# Patient Record
Sex: Male | Born: 1950 | Race: White | Hispanic: No | Marital: Married | Smoking: Former smoker
Health system: Southern US, Community
[De-identification: ages and names within clinical notes are randomized; demographics above are authoritative.]

## PROBLEM LIST (undated history)

## (undated) DIAGNOSIS — G4730 Sleep apnea, unspecified: Secondary | ICD-10-CM

## (undated) DIAGNOSIS — T7840XA Allergy, unspecified, initial encounter: Secondary | ICD-10-CM

## (undated) DIAGNOSIS — I2510 Atherosclerotic heart disease of native coronary artery without angina pectoris: Secondary | ICD-10-CM

## (undated) DIAGNOSIS — Z5189 Encounter for other specified aftercare: Secondary | ICD-10-CM

## (undated) DIAGNOSIS — Z87442 Personal history of urinary calculi: Secondary | ICD-10-CM

## (undated) DIAGNOSIS — E785 Hyperlipidemia, unspecified: Secondary | ICD-10-CM

## (undated) DIAGNOSIS — J45909 Unspecified asthma, uncomplicated: Secondary | ICD-10-CM

## (undated) DIAGNOSIS — N289 Disorder of kidney and ureter, unspecified: Secondary | ICD-10-CM

## (undated) DIAGNOSIS — I10 Essential (primary) hypertension: Secondary | ICD-10-CM

## (undated) HISTORY — DX: Allergy, unspecified, initial encounter: T78.40XA

## (undated) HISTORY — DX: Hyperlipidemia, unspecified: E78.5

## (undated) HISTORY — DX: Unspecified asthma, uncomplicated: J45.909

## (undated) HISTORY — DX: Atherosclerotic heart disease of native coronary artery without angina pectoris: I25.10

## (undated) HISTORY — DX: Encounter for other specified aftercare: Z51.89

## (undated) HISTORY — DX: Essential (primary) hypertension: I10

## (undated) HISTORY — PX: EYE SURGERY: SHX253

---

## 2003-01-02 ENCOUNTER — Encounter: Payer: Self-pay | Admitting: Family Medicine

## 2003-10-02 HISTORY — PX: CORONARY ARTERY BYPASS GRAFT: SHX141

## 2003-10-04 ENCOUNTER — Other Ambulatory Visit: Payer: Self-pay

## 2003-10-04 DIAGNOSIS — I2510 Atherosclerotic heart disease of native coronary artery without angina pectoris: Secondary | ICD-10-CM

## 2003-10-04 HISTORY — PX: CORONARY ANGIOPLASTY: SHX604

## 2003-10-04 HISTORY — DX: Atherosclerotic heart disease of native coronary artery without angina pectoris: I25.10

## 2005-01-01 ENCOUNTER — Encounter: Payer: Self-pay | Admitting: Family Medicine

## 2005-01-22 ENCOUNTER — Ambulatory Visit: Payer: Self-pay | Admitting: Family Medicine

## 2005-01-26 ENCOUNTER — Ambulatory Visit: Payer: Self-pay | Admitting: Family Medicine

## 2005-02-10 ENCOUNTER — Ambulatory Visit: Payer: Self-pay | Admitting: Family Medicine

## 2005-03-10 ENCOUNTER — Ambulatory Visit: Payer: Self-pay | Admitting: Family Medicine

## 2005-04-28 ENCOUNTER — Ambulatory Visit: Payer: Self-pay | Admitting: Family Medicine

## 2005-04-30 ENCOUNTER — Ambulatory Visit: Payer: Self-pay | Admitting: Family Medicine

## 2006-08-19 ENCOUNTER — Ambulatory Visit: Payer: Self-pay | Admitting: Family Medicine

## 2006-09-10 ENCOUNTER — Ambulatory Visit: Payer: Self-pay | Admitting: Family Medicine

## 2006-09-10 LAB — CONVERTED CEMR LAB
Albumin: 4 g/dL (ref 3.5–5.2)
Basophils Absolute: 0 10*3/uL (ref 0.0–0.1)
Bilirubin, Direct: 0.1 mg/dL (ref 0.0–0.3)
Calcium: 9.8 mg/dL (ref 8.4–10.5)
Cholesterol: 136 mg/dL (ref 0–200)
Creatinine,U: 114.4 mg/dL
Eosinophils Absolute: 0.4 10*3/uL (ref 0.0–0.6)
Eosinophils Relative: 7.4 % — ABNORMAL HIGH (ref 0.0–5.0)
GFR calc Af Amer: 114 mL/min
GFR calc non Af Amer: 94 mL/min
Glucose, Bld: 131 mg/dL — ABNORMAL HIGH (ref 70–99)
Lymphocytes Relative: 27.8 % (ref 12.0–46.0)
MCHC: 35.2 g/dL (ref 30.0–36.0)
MCV: 90.7 fL (ref 78.0–100.0)
Microalb Creat Ratio: 1.7 mg/g (ref 0.0–30.0)
Neutro Abs: 3.3 10*3/uL (ref 1.4–7.7)
Neutrophils Relative %: 56.2 % (ref 43.0–77.0)
PSA: 0.19 ng/mL (ref 0.10–4.00)
Platelets: 255 10*3/uL (ref 150–400)
Sodium: 141 meq/L (ref 135–145)
Total CHOL/HDL Ratio: 2.7
Triglycerides: 90 mg/dL (ref 0–149)
WBC: 5.8 10*3/uL (ref 4.5–10.5)

## 2006-09-14 ENCOUNTER — Ambulatory Visit: Payer: Self-pay | Admitting: Family Medicine

## 2006-10-20 ENCOUNTER — Ambulatory Visit: Payer: Self-pay | Admitting: Family Medicine

## 2006-10-27 ENCOUNTER — Ambulatory Visit: Payer: Self-pay | Admitting: Family Medicine

## 2007-04-06 ENCOUNTER — Encounter: Payer: Self-pay | Admitting: Family Medicine

## 2007-04-06 DIAGNOSIS — D62 Acute posthemorrhagic anemia: Secondary | ICD-10-CM | POA: Insufficient documentation

## 2007-04-06 DIAGNOSIS — E1129 Type 2 diabetes mellitus with other diabetic kidney complication: Secondary | ICD-10-CM | POA: Insufficient documentation

## 2007-04-06 DIAGNOSIS — E119 Type 2 diabetes mellitus without complications: Secondary | ICD-10-CM | POA: Insufficient documentation

## 2007-04-06 DIAGNOSIS — Z87891 Personal history of nicotine dependence: Secondary | ICD-10-CM | POA: Insufficient documentation

## 2007-04-18 ENCOUNTER — Ambulatory Visit: Payer: Self-pay | Admitting: Family Medicine

## 2007-09-07 ENCOUNTER — Ambulatory Visit: Payer: Self-pay | Admitting: Family Medicine

## 2007-09-07 LAB — CONVERTED CEMR LAB
ALT: 39 units/L (ref 0–53)
Alkaline Phosphatase: 42 units/L (ref 39–117)
BUN: 17 mg/dL (ref 6–23)
Basophils Relative: 0.4 % (ref 0.0–1.0)
CO2: 30 meq/L (ref 19–32)
Calcium: 9.6 mg/dL (ref 8.4–10.5)
Chloride: 103 meq/L (ref 96–112)
GFR calc Af Amer: 90 mL/min
HDL: 55.7 mg/dL (ref >39.0)
Hgb A1c MFr Bld: 5.7 % (ref 4.6–6.0)
LDL Cholesterol: 47 mg/dL (ref 0–99)
Microalb Creat Ratio: 2 mg/g (ref 0.0–30.0)
Monocytes Relative: 7.2 % (ref 3.0–11.0)
Neutro Abs: 3 10*3/uL (ref 1.4–7.7)
Platelets: 244 10*3/uL (ref 150–400)
RBC: 4.79 M/uL (ref 4.22–5.81)
RDW: 12.4 % (ref 11.5–14.6)
TSH: 3.97 microintl units/mL (ref 0.35–5.50)
Total CHOL/HDL Ratio: 2
Total Protein: 7.2 g/dL (ref 6.0–8.3)
Triglycerides: 54 mg/dL (ref 0–149)
VLDL: 11 mg/dL (ref 0–40)

## 2007-09-13 ENCOUNTER — Ambulatory Visit: Payer: Self-pay | Admitting: Family Medicine

## 2008-03-22 ENCOUNTER — Ambulatory Visit: Payer: Self-pay | Admitting: Family Medicine

## 2008-03-22 DIAGNOSIS — I10 Essential (primary) hypertension: Secondary | ICD-10-CM | POA: Insufficient documentation

## 2008-03-22 LAB — CONVERTED CEMR LAB
CO2: 28 meq/L (ref 19–32)
Calcium: 9.3 mg/dL (ref 8.4–10.5)
GFR calc Af Amer: 100 mL/min
Hgb A1c MFr Bld: 5.8 % (ref 4.6–6.0)
Sodium: 138 meq/L (ref 135–145)

## 2008-03-27 ENCOUNTER — Ambulatory Visit: Payer: Self-pay | Admitting: Family Medicine

## 2008-05-16 ENCOUNTER — Telehealth: Payer: Self-pay | Admitting: Family Medicine

## 2008-09-19 ENCOUNTER — Ambulatory Visit: Payer: Self-pay | Admitting: Family Medicine

## 2008-09-20 LAB — CONVERTED CEMR LAB
ALT: 33 units/L (ref 0–53)
Albumin: 4.4 g/dL (ref 3.5–5.2)
Basophils Absolute: 0 10*3/uL (ref 0.0–0.1)
Basophils Relative: 0.9 % (ref 0.0–3.0)
Calcium: 9.6 mg/dL (ref 8.4–10.5)
Cholesterol: 104 mg/dL (ref 0–200)
Creatinine, Ser: 1 mg/dL (ref 0.4–1.5)
Creatinine,U: 124.2 mg/dL
Eosinophils Absolute: 0.3 10*3/uL (ref 0.0–0.7)
GFR calc non Af Amer: 82 mL/min
HCT: 43.4 % (ref 39.0–52.0)
Hemoglobin: 15.1 g/dL (ref 13.0–17.0)
MCHC: 34.7 g/dL (ref 30.0–36.0)
MCV: 91.7 fL (ref 78.0–100.0)
Microalb, Ur: 0.2 mg/dL (ref 0.0–1.9)
Neutro Abs: 2.9 10*3/uL (ref 1.4–7.7)
PSA: 0.18 ng/mL (ref 0.10–4.00)
RBC: 4.73 M/uL (ref 4.22–5.81)
Total Bilirubin: 0.9 mg/dL (ref 0.3–1.2)

## 2008-09-26 ENCOUNTER — Ambulatory Visit: Payer: Self-pay | Admitting: Family Medicine

## 2008-09-26 DIAGNOSIS — E039 Hypothyroidism, unspecified: Secondary | ICD-10-CM | POA: Insufficient documentation

## 2008-10-10 ENCOUNTER — Ambulatory Visit: Payer: Self-pay | Admitting: Family Medicine

## 2008-10-10 LAB — FECAL OCCULT BLOOD, GUAIAC: Fecal Occult Blood: NEGATIVE

## 2008-10-10 LAB — CONVERTED CEMR LAB: OCCULT 3: NEGATIVE

## 2008-10-11 ENCOUNTER — Encounter (INDEPENDENT_AMBULATORY_CARE_PROVIDER_SITE_OTHER): Payer: Self-pay | Admitting: *Deleted

## 2008-11-13 ENCOUNTER — Ambulatory Visit: Payer: Self-pay | Admitting: Family Medicine

## 2008-11-20 ENCOUNTER — Telehealth: Payer: Self-pay | Admitting: Family Medicine

## 2008-12-04 ENCOUNTER — Ambulatory Visit: Payer: Self-pay | Admitting: Family Medicine

## 2008-12-04 DIAGNOSIS — G4730 Sleep apnea, unspecified: Secondary | ICD-10-CM | POA: Insufficient documentation

## 2008-12-07 ENCOUNTER — Ambulatory Visit: Payer: Self-pay | Admitting: Pulmonary Disease

## 2008-12-07 ENCOUNTER — Encounter: Payer: Self-pay | Admitting: Family Medicine

## 2008-12-26 ENCOUNTER — Ambulatory Visit (HOSPITAL_BASED_OUTPATIENT_CLINIC_OR_DEPARTMENT_OTHER): Admission: RE | Admit: 2008-12-26 | Discharge: 2008-12-26 | Payer: Self-pay | Admitting: Pulmonary Disease

## 2008-12-26 ENCOUNTER — Encounter: Payer: Self-pay | Admitting: Pulmonary Disease

## 2009-01-03 ENCOUNTER — Ambulatory Visit: Payer: Self-pay | Admitting: Pulmonary Disease

## 2009-01-09 ENCOUNTER — Ambulatory Visit: Payer: Self-pay | Admitting: Pulmonary Disease

## 2009-01-14 ENCOUNTER — Ambulatory Visit: Payer: Self-pay | Admitting: Family Medicine

## 2009-01-14 DIAGNOSIS — E669 Obesity, unspecified: Secondary | ICD-10-CM | POA: Insufficient documentation

## 2009-02-06 ENCOUNTER — Encounter: Payer: Self-pay | Admitting: Pulmonary Disease

## 2009-02-07 ENCOUNTER — Telehealth (INDEPENDENT_AMBULATORY_CARE_PROVIDER_SITE_OTHER): Payer: Self-pay | Admitting: *Deleted

## 2009-02-08 ENCOUNTER — Encounter: Payer: Self-pay | Admitting: Pulmonary Disease

## 2009-02-19 ENCOUNTER — Ambulatory Visit: Payer: Self-pay | Admitting: Pulmonary Disease

## 2009-09-25 ENCOUNTER — Ambulatory Visit: Payer: Self-pay | Admitting: Family Medicine

## 2009-09-25 LAB — CONVERTED CEMR LAB
AST: 29 units/L (ref 0–37)
Alkaline Phosphatase: 50 units/L (ref 39–117)
BUN: 13 mg/dL (ref 6–23)
Basophils Absolute: 0 10*3/uL (ref 0.0–0.1)
Bilirubin, Direct: 0.1 mg/dL (ref 0.0–0.3)
Calcium: 9.5 mg/dL (ref 8.4–10.5)
Creatinine, Ser: 1 mg/dL (ref 0.4–1.5)
Eosinophils Absolute: 0.3 10*3/uL (ref 0.0–0.7)
Free T4: 0.8 ng/dL (ref 0.6–1.6)
GFR calc non Af Amer: 82.01 mL/min (ref >60)
Glucose, Bld: 125 mg/dL — ABNORMAL HIGH (ref 70–99)
HDL: 50.3 mg/dL (ref >39.00)
Hemoglobin: 14.2 g/dL (ref 13.0–17.0)
Lymphocytes Relative: 30.2 % (ref 12.0–46.0)
MCHC: 33.5 g/dL (ref 30.0–36.0)
Microalb Creat Ratio: 2 mg/g (ref 0.0–30.0)
Microalb, Ur: 0.3 mg/dL (ref 0.0–1.9)
Monocytes Relative: 7.2 % (ref 3.0–12.0)
Neutrophils Relative %: 57.2 % (ref 43.0–77.0)
PSA: 0.25 ng/mL (ref 0.10–4.00)
Platelets: 226 10*3/uL (ref 150.0–400.0)
RDW: 12.3 % (ref 11.5–14.6)
Sodium: 139 meq/L (ref 135–145)
Total Bilirubin: 0.6 mg/dL (ref 0.3–1.2)
Triglycerides: 80 mg/dL (ref 0.0–149.0)
VLDL: 16 mg/dL (ref 0.0–40.0)

## 2009-09-30 ENCOUNTER — Ambulatory Visit: Payer: Self-pay | Admitting: Family Medicine

## 2010-02-26 ENCOUNTER — Ambulatory Visit: Payer: Self-pay | Admitting: Family Medicine

## 2010-02-27 LAB — CONVERTED CEMR LAB
ALT: 23 units/L (ref 0–53)
AST: 18 units/L (ref 0–37)
Cholesterol: 143 mg/dL (ref 0–200)
VLDL: 13.6 mg/dL (ref 0.0–40.0)

## 2010-03-06 ENCOUNTER — Ambulatory Visit: Payer: Self-pay | Admitting: Family Medicine

## 2010-07-10 ENCOUNTER — Telehealth: Payer: Self-pay | Admitting: Family Medicine

## 2010-09-02 NOTE — Assessment & Plan Note (Signed)
Summary: cpx/bir   Vital Signs:  Patient profile:   60 year old male Weight:      250 pounds Temp:     98.4 degrees F oral Pulse rate:   60 / minute Pulse rhythm:   regular BP sitting:   112 / 72  (left arm) Cuff size:   large  Vitals Entered By: Sydell Axon LPN (September 30, 2009 1:52 PM) CC: 30 minute checkup, hemoccult cards given to patient   History of Present Illness: Pt here for Comp Exam, was diagnoised with sleep apnea and loves his CPAP after getting used to his mask. He has no complaints today. He is now on the Nutrisystem D plan and has been on it for three weeks. He has lost 15 pounds. He is learning portions.   Preventive Screening-Counseling & Management  Alcohol-Tobacco     Alcohol drinks/day: 0     Alcohol type: drinks, wine or coctail     Smoking Status: quit     Year Started: 2002     Year Quit: 2005     Pack years: 21     Passive Smoke Exposure: no  Caffeine-Diet-Exercise     Caffeine use/day: 4     Does Patient Exercise: yes     Type of exercise: elliptical     Times/week: 4  Problems Prior to Update: 1)  Exogenous Obesity  (ICD-278.00) 2)  Sleep Apnea  (ICD-780.57) 3)  Special Screening Malig Neoplasms Other Sites  (ICD-V76.49) 4)  Underactive Thyroid  (ICD-244.9) 5)  Essential Hypertension, Benign  (ICD-401.1) 6)  Health Maintenance Exam  (ICD-V70.0) 7)  Special Screening Malignant Neoplasm of Prostate  (ICD-V76.44) 8)  Hypercholesterolemia, 180/ldl 122  (ICD-272.0) 9)  Hx, Personal, Tobacco Use, Quit 03/05  (ICD-V15.82) 10)  Anemia, Post-operative  (ICD-285.1) 11)  Coronary Artery Disease, Cabg X 4  (ICD-414.00) 12)  Diabetes Mellitus, Type II  (ICD-250.00)  Medications Prior to Update: 1)  Vytorin 10-40 Mg Tabs (Ezetimibe-Simvastatin) .... Take One By Mouth At Bedtime 2)  Lisinopril 10 Mg  Tabs (Lisinopril) .... Take One By Mouth Every Morning 3)  Metoprolol Tartrate 25 Mg  Tabs (Metoprolol Tartrate) .Marland Kitchen.. 1 Daily By Mouth 4)  Proair  Hfa 108 (90 Base) Mcg/act Aers (Albuterol Sulfate) .... Use As Directed 5)  Bayer Low Strength 81 Mg Tbec (Aspirin) .Marland Kitchen.. 1 Daily By Mouth  Allergies: 1)  ! * Cats and Dust  Past History:  Past Medical History: Last updated: 04/06/2007 Diabetes mellitus, type II Coronary artery disease  Past Surgical History: Last updated: 04/06/2007 Cath [facility]) EF 70%, 3 vessell dz,([place]) 10/04/03 CABG  atrial septal defect repair,  03/03- 10/15/06 Echo wnl ([place]) 11/16/03 Exercise Cardiolite wnl (Myoview) EF 60% 12/03/03  Family History: Last updated: 09/30/2009 Father  A  78 CABG x 4 (69) Mother dec (2/09) in [place]  HTN, depression, bad stroke right sided  Brother A  52 obese 350+.  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  smoker Brother A 16 Sister A 58  one lifestyle, ETOH, smoker(pot), knee replacement bilat., disabled Sister A 52 CV + Father CABG x 4 HBP + Mother DM - none Breast/Ovarian/Uterine cancer - none Depression + Mother ETOH/Drug - none Stroke - none  Review of Systems General:  Denies chills, fatigue, fever, sweats, weakness, and weight loss. Eyes:  Denies blurring, discharge, and eye pain. ENT:  Denies decreased hearing, ear discharge, and earache. CV:  Denies chest pain or discomfort, fainting, fatigue,  palpitations, shortness of breath with exertion, swelling of feet, and swelling of hands. Resp:  Denies cough, shortness of breath, and wheezing. GI:  Denies abdominal pain, bloody stools, change in bowel habits, constipation, dark tarry stools, diarrhea, indigestion, loss of appetite, nausea, vomiting, vomiting blood, and yellowish skin color. GU:  Denies dysuria, nocturia, and urinary frequency. MS:  Denies joint pain, low back pain, muscle aches, cramps, and muscle weakness. Derm:  Denies changes in color of skin, changes in nail beds, dryness, excessive perspiration, flushing, hair loss, insect bite(s), itching, lesion(s), poor wound healing, and rash. Neuro:  Denies numbness, poor balance, tingling, and tremors.  Physical Exam  General:  Well-developed,well-nourished,in no acute distress; alert,appropriate and cooperative throughout examination, mildly overweight with thickened neck. Head:  Normocephalic and atraumatic without obvious abnormalities. No apparent alopecia or balding. Eyes:  Conjunctiva clear bilaterally.  Ears:  External ear exam shows no significant lesions or deformities.  Otoscopic examination reveals clear canals, tympanic membranes are intact bilaterally without bulging, retraction, inflammation or discharge. Hearing is grossly normal bilaterally. Nose:  External nasal examination shows no deformity or inflammation. Nasal mucosa are pink and moist without lesions or exudates. Mouth:  Oral mucosa and oropharynx without lesions or exudates.  Teeth in good repair. Neck:  No deformities, masses, or tenderness noted. Increased girth. Chest Wall:  No deformities, masses, tenderness or gynecomastia noted. Breasts:  No masses or gynecomastia noted Lungs:  Normal respiratory effort, chest expands symmetrically. Lungs are clear to auscultation, no crackles or wheezes. Heart:  Normal rate and regular rhythm. S1 and S2 normal without gallop, murmur, click, rub or other extra  sounds. Abdomen:  Bowel sounds positive,abdomen soft and non-tender without masses, organomegaly or hernias noted. Rectal:  No external abnormalities noted. Normal sphincter tone. No rectal masses or tenderness. G neg. Genitalia:  Testes bilaterally descended without nodularity, tenderness or masses. No scrotal masses or lesions. No penis lesions or urethral discharge. Prostate:  Prostate gland firm and smooth, no enlargement, nodularity, tenderness, mass, asymmetry or induration. 30 gms Msk:  No deformity or scoliosis noted of thoracic or lumbar spine.   Pulses:  R and L carotid,radial,femoral,dorsalis pedis and posterior tibial pulses are full and equal bilaterally Extremities:  No clubbing, cyanosis, edema, or deformity noted with normal full range of motion of all joints.   Neurologic:  No cranial nerve deficits noted. Station and gait are normal. Sensory, motor and coordinative functions appear intact. Skin:  Intact without suspicious lesions or rashes Cervical Nodes:  No lymphadenopathy noted Inguinal Nodes:  No significant adenopathy Psych:  Cognition and judgment appear intact. Alert and cooperative with normal attention span and concentration. No apparent delusions, illusions, hallucinations  Diabetes Management Exam:    Foot Exam (with socks and/or shoes not present):       Sensory-Pinprick/Light touch:          Left medial foot (L-4): normal  Left dorsal foot (L-5): normal          Left lateral foot (S-1): normal          Right medial foot (L-4): normal          Right dorsal foot (L-5): normal          Right lateral foot (S-1): normal       Sensory-Monofilament:          Left foot: normal          Right foot: normal       Inspection:          Left foot: normal          Right foot: normal       Nails:          Left foot: normal          Right foot: normal   Impression & Recommendations:  Problem # 1:  HEALTH MAINTENANCE EXAM (ICD-V70.0) Assessment Comment  Only  Problem # 2:  SLEEP APNEA (ICD-780.57) Assessment: Improved Cont CPAP.  Problem # 3:  UNDERACTIVE THYROID (ICD-244.9) Assessment: Unchanged Stable. Labs Reviewed: TSH: 2.43 (09/25/2009)    HgBA1c: 6.5 (09/25/2009) Chol: 86 (09/25/2009)   HDL: 50.30 (09/25/2009)   LDL: 20 (09/25/2009)   TG: 80.0 (09/25/2009)  Problem # 4:  ESSENTIAL HYPERTENSION, BENIGN (ICD-401.1) Assessment: Unchanged Good control, cont meds. His updated medication list for this problem includes:    Lisinopril 10 Mg Tabs (Lisinopril) .Marland Kitchen... Take one by mouth every morning    Metoprolol Tartrate 25 Mg Tabs (Metoprolol tartrate) .Marland Kitchen... 1 daily by mouth  BP today: 112/72 Prior BP: 118/72 (02/19/2009)  Labs Reviewed: K+: 4.7 (09/25/2009) Creat: : 1.0 (09/25/2009)   Chol: 86 (09/25/2009)   HDL: 50.30 (09/25/2009)   LDL: 20 (09/25/2009)   TG: 80.0 (09/25/2009)  Problem # 5:  SPECIAL SCREENING MALIGNANT NEOPLASM OF PROSTATE (ICD-V76.44) Assessment: Unchanged Stable PSA and exam.  Problem # 6:  HYPERCHOLESTEROLEMIA, 180/LDL 122 (ICD-272.0) Assessment: Unchanged Good control. Cont. His updated medication list for this problem includes:    Simvastatin 40 Mg Tabs (Simvastatin) ..... One tab by mouth at night.  Labs Reviewed: SGOT: 29 (09/25/2009)   SGPT: 47 (09/25/2009)   HDL:50.30 (09/25/2009), 44.4 (09/19/2008)  LDL:20 (09/25/2009), 46 (09/19/2008)  Chol:86 (09/25/2009), 104 (09/19/2008)  Trig:80.0 (09/25/2009), 66 (09/19/2008)  Problem # 7:  DIABETES MELLITUS, TYPE II (ICD-250.00) Assessment: Unchanged Adequate. Is losing weight and will be even better in the future. Pt is committed. His updated medication list for this problem includes:    Lisinopril 10 Mg Tabs (Lisinopril) .Marland Kitchen... Take one by mouth every morning    Bayer Low Strength 81 Mg Tbec (Aspirin) .Marland Kitchen... 1 daily by mouth  Labs Reviewed: Creat: 1.0 (09/25/2009)   Microalbumin: 1.7 (09/10/2006) Reviewed HgBA1c results: 6.5 (09/25/2009)  6.0  (09/19/2008)  Complete Medication List: 1)  Simvastatin 40 Mg Tabs (Simvastatin) .... One tab by mouth at night. 2)  Lisinopril 10 Mg Tabs (Lisinopril) .... Take one by mouth every morning 3)  Metoprolol Tartrate 25 Mg Tabs (Metoprolol tartrate) .Marland Kitchen.. 1 daily by mouth 4)  Proair Hfa 108 (90 Base) Mcg/act Aers (Albuterol sulfate) .... Use as directed 5)  Bayer Low Strength 81 Mg Tbec (Aspirin) .Marland Kitchen.. 1 daily by mouth  Patient Instructions: 1)  RTC 3 mos for chol recheck on new meds.  272.0 2)  Get eye exam. Prescriptions: SIMVASTATIN 40 MG TABS (SIMVASTATIN) one tab by mouth at night.  #30 x 12  Entered and Authorized by:   Shaune Leeks MD   Signed by:   Shaune Leeks MD on 09/30/2009   Method used:   Electronically to        [facility]. [address] [phone]* (retail)       [address] [place], [place]  [id number]       Ph: [phone]       Fax: [phone]   RxID:   [id number]   Current Allergies (reviewed today): ! * CATS AND DUST

## 2010-09-02 NOTE — Progress Notes (Signed)
Summary: Rx Proair  Phone Note Refill Request Message from:  [facility] [address] [place]. on July 10, 2010 4:34 PM  Refills Requested: Medication #1:  PROAIR HFA 108 (90 BASE) MCG/ACT AERS use as directed One puff every 4 hours as needed, does not match med sheet.    Method Requested: Electronic Initial call taken by: Sydell Axon LPN,  July 10, 2010 4:35 PM  Follow-up for Phone Call        please change med list to One puff every 4 hours as needed, please send in for #1 with 5rf.  thanks.  Follow-up by: Crawford Givens MD,  July 10, 2010 5:53 PM  Additional Follow-up for Phone Call Additional follow up Details #1::        Med list changed.  Medication sent to pharmacy Additional Follow-up by: Delilah Shan CMA (AAMA),  July 11, 2010 8:08 AM    New/Updated Medications: PROAIR HFA 108 (90 BASE) MCG/ACT AERS (ALBUTEROL SULFATE) One puff every 4 hours as needed Prescriptions: PROAIR HFA 108 (90 BASE) MCG/ACT AERS (ALBUTEROL SULFATE) One puff every 4 hours as needed  #1 x 5   Entered by:   Delilah Shan CMA (AAMA)   Authorized by:   Crawford Givens MD   Signed by:   Delilah Shan CMA (AAMA) on 07/11/2010   Method used:   Electronically to        [facility]. [address] [phone]* (retail)       [address] [place], [place]  [address]       Ph: [phone]       Fax: [phone]   RxID:   [id number]

## 2010-09-02 NOTE — Assessment & Plan Note (Signed)
Summary: ESTAB FROM [facility]   Vital Signs:  Patient profile:   60 year old male Height:      72.5 inches Weight:      249.75 pounds BMI:     33.53 Temp:     98 degrees F oral Pulse rate:   88 / minute Pulse rhythm:   regular BP sitting:   102 / 68  (left arm) Cuff size:   large  Vitals Entered By: Delilah Shan CMA Duncan Dull) (March 06, 2010 8:17 AM) CC: Transfer from [facility].  Patient does not know what meds he is taking.  He states that one of his BP meds, he is taking 1/2 tab but cannot cut it in half so he takes 1 tab per day every other week.   History of Present Illness: Elevated Cholesterol: Using medications without problems:yes Muscle aches: no Other complaints: no  Hypertension:      Using medication without problems or lightheadedness: yes Chest pain with exertion:no Edema:no Short of breath:no Average home BPs: not checked Other issues: no  OSA- doing well on CPAP.   Patient is taking 1 of the meds qday for 1 week and then coming off it for a week.  Pt will need pill splitter.    Allergies: 1)  ! * Cats and Dust  Past History:  Family History: Last updated: 09/30/2009 Father  A  78 CABG x 4 (69) Mother dec (2/09) in [place]  HTN, depression, bad stroke right sided  Brother A  52 obese 350+.  smoker Brother A 34 Sister A 58  one lifestyle, ETOH, smoker(pot), knee replacement bilat., disabled Sister A 52 CV + Father CABG x 4 HBP + Mother DM - none Breast/Ovarian/Uterine cancer - none Depression + Mother ETOH/Drug - none Stroke - none  Social History: Last updated: 03/06/2010 Marital Status: Married Children: 2,  Occupation: Programmer, systems, V/P  Programmer, multimedia, playing guitar quit smoking day of CABG, prev 3 PPD alcohol: 5-6 drinks in a week  Past Medical History: Diabetes mellitus, type II Coronary artery disease s/p CABG HTN HLD  Past Surgical History: Reviewed history from 04/06/2007 and no changes required. Cath [facility]) EF 70%, 3  vessell dz,([place]) 10/04/03 CABG  atrial septal defect repair,  03/03- 10/15/06 Echo wnl ([place]) 11/16/03 Exercise Cardiolite wnl (Myoview) EF 60% 12/03/03  Social History: Reviewed history from 12/07/2008 and no changes required. Marital Status: Married Children: 2,  Occupation: Programmer, systems, V/P  Programmer, multimedia, playing guitar quit smoking day of CABG, prev 3 PPD alcohol: 5-6 drinks in a week  Physical Exam  General:  GEN: nad, alert and oriented HEENT: mucous membranes moist NECK: supple w/o LA CV: rrr.  no murmur PULM: ctab, no inc wob ABD: soft, +bs EXT: no edema SKIN: no acute rash    Impression & Recommendations:  Problem # 1:  SLEEP APNEA (ICD-780.57) Continue cpap.  no change.   Problem # 2:  ESSENTIAL HYPERTENSION, BENIGN (ICD-401.1) D/w patient re: med dosing.  He'll get a pill splitter.  His updated medication list for this problem includes:    Lisinopril 10 Mg Tabs (Lisinopril) .Marland Kitchen... Take one by mouth every morning    Metoprolol Tartrate 25 Mg Tabs (Metoprolol tartrate) .Marland Kitchen... 1 daily by mouth  Problem # 3:  HYPERCHOLESTEROLEMIA, 180/LDL 122 (ICD-272.0) No change in meds.  D/w patient re: weight loss.  His updated medication list for this problem includes:    Simvastatin 40 Mg Tabs (Simvastatin) ..... One tab by  mouth at night.  Complete Medication List: 1)  Simvastatin 40 Mg Tabs (Simvastatin) .... One tab by mouth at night. 2)  Lisinopril 10 Mg Tabs (Lisinopril) .... Take one by mouth every morning 3)  Metoprolol Tartrate 25 Mg Tabs (Metoprolol tartrate) .Marland Kitchen.. 1 daily by mouth 4)  Proair Hfa 108 (90 Base) Mcg/act Aers (Albuterol sulfate) .... Use as directed 5)  Bayer Low Strength 81 Mg Tbec (Aspirin) .Marland Kitchen.. 1 daily by mouth  Patient Instructions: 1)  Use a pill splitter and let me know if you have trouble with that.   2)  physical in 10/2010. 3)  fasting labs a few days before. 4)  cmet/lipid 401.1 5)  PSA v76.44 6)  Work on [employer]  and exericse.   Current Allergies (reviewed today): ! * CATS AND DUST

## 2010-10-02 ENCOUNTER — Encounter (INDEPENDENT_AMBULATORY_CARE_PROVIDER_SITE_OTHER): Payer: Self-pay | Admitting: *Deleted

## 2010-10-02 ENCOUNTER — Other Ambulatory Visit (INDEPENDENT_AMBULATORY_CARE_PROVIDER_SITE_OTHER): Payer: BC Managed Care – PPO

## 2010-10-02 ENCOUNTER — Other Ambulatory Visit: Payer: Self-pay | Admitting: Family Medicine

## 2010-10-02 DIAGNOSIS — Z125 Encounter for screening for malignant neoplasm of prostate: Secondary | ICD-10-CM

## 2010-10-02 DIAGNOSIS — I10 Essential (primary) hypertension: Secondary | ICD-10-CM

## 2010-10-02 LAB — HEPATIC FUNCTION PANEL
ALT: 41 U/L (ref 0–53)
AST: 23 U/L (ref 0–37)
Alkaline Phosphatase: 51 U/L (ref 39–117)
Total Bilirubin: 0.8 mg/dL (ref 0.3–1.2)

## 2010-10-02 LAB — LIPID PANEL
HDL: 51.3 mg/dL (ref >39.00)
LDL Cholesterol: 84 mg/dL (ref 0–99)
Total CHOL/HDL Ratio: 3
Triglycerides: 108 mg/dL (ref 0.0–149.0)

## 2010-10-02 LAB — BASIC METABOLIC PANEL
BUN: 20 mg/dL (ref 6–23)
Chloride: 102 mEq/L (ref 96–112)
Potassium: 4.7 mEq/L (ref 3.5–5.1)

## 2010-10-07 ENCOUNTER — Encounter: Payer: Self-pay | Admitting: Family Medicine

## 2010-10-09 ENCOUNTER — Other Ambulatory Visit: Payer: Self-pay | Admitting: Family Medicine

## 2010-10-09 ENCOUNTER — Encounter (INDEPENDENT_AMBULATORY_CARE_PROVIDER_SITE_OTHER): Payer: BC Managed Care – PPO | Admitting: Family Medicine

## 2010-10-09 ENCOUNTER — Encounter: Payer: Self-pay | Admitting: Family Medicine

## 2010-10-09 ENCOUNTER — Telehealth: Payer: Self-pay | Admitting: Family Medicine

## 2010-10-09 DIAGNOSIS — I10 Essential (primary) hypertension: Secondary | ICD-10-CM

## 2010-10-09 DIAGNOSIS — Z0000 Encounter for general adult medical examination without abnormal findings: Secondary | ICD-10-CM

## 2010-10-09 DIAGNOSIS — E119 Type 2 diabetes mellitus without complications: Secondary | ICD-10-CM

## 2010-10-09 DIAGNOSIS — E7800 Pure hypercholesterolemia, unspecified: Secondary | ICD-10-CM

## 2010-10-14 NOTE — Letter (Signed)
Summary: [place] [facility]: Immunoassay Fecal Occult Blood (iFOB) Order Form  [place] [facility]  [address] [place], [place] [address]   Phone: [phone]  Fax: [phone]      [place] [facility]: Immunoassay Fecal Occult Blood (iFOB) Order Form   October 09, 2010 MRN: [id number]   TOMOTHY EDDINS Sep 21, 1950   Physicican Name:_______duncan__________________  Diagnosis Code:________v76.49__________________      Crawford Givens MD

## 2010-10-14 NOTE — Progress Notes (Signed)
Summary: pt takes metoprolol  Phone Note Call from Patient Call back at Home Phone [phone]   Caller: Patient Summary of Call: Pt called to report that he is taking metoprolol tartrate 25 mg's, takes one half a day.  He was told to call and let you know.                Lowella Petties CMA, AAMA  October 09, 2010 3:10 PM   Follow-up for Phone Call        noted. med list adjusted.  Follow-up by: Crawford Givens MD,  October 09, 2010 3:25 PM    New/Updated Medications: METOPROLOL TARTRATE 25 MG  TABS (METOPROLOL TARTRATE) 1/2 DAILY by mouth

## 2010-10-14 NOTE — Assessment & Plan Note (Signed)
Summary: CPX/[facility]H   Vital Signs:  Patient profile:   60 year old male Height:      72.5 inches Weight:      260.25 pounds BMI:     34.94 Temp:     98.2 degrees F oral Pulse rate:   80 / minute Pulse rhythm:   regular BP sitting:   120 / 74  (left arm) Cuff size:   large  Vitals Entered By: Delilah Shan CMA Rachyl Wuebker Dull) (October 09, 2010 11:47 AM) CC: CPX   History of Present Illness: CPE:  See plan.    Elevated Cholesterol: Using medications without problems:yes Muscle aches: no Other complaints:  Hypertension: he had been cutting one med in half, but is unsure which one, I asked him to call about about this Using medication without problems or lightheadedness: yes Chest pain with exertion:no Edema:no Short of breath:no Other issues:we talked about weight.  see below.   Diabetes:  Using medications without difficulties:no meds Hypoglycemic episodes:no Hyperglycemic episodes:no "If I cut out everything that I wanted to eat, my sugar is  ~100".  Due for A1c.   He was prev on fen/phen and he wanted to talk about qnexa. See below.   Allergies: 1)  ! * Cats and Dust  Past History:  Past Medical History: Last updated: 03/06/2010 Diabetes mellitus, type II Coronary artery disease s/p CABG HTN HLD  Past Surgical History: Last updated: 04/06/2007 Cath [facility]) EF 70%, 3 vessell dz,([place]) 10/04/03 CABG  atrial septal defect repair,  03/03- 10/15/06 Echo wnl ([place]) 11/16/03 Exercise Cardiolite wnl (Myoview) EF 60% 12/03/03  Family History: Reviewed history from 09/30/2009 and no changes required. Father  A  CABG x 4 (69) Mother dec (2/09) in [place]  HTN, depression, bad stroke right sided  Brother A obese 350+.  smoker Brother A Sister A one lifestyle, ETOH, smoker(pot), knee replacement bilat., disabled Sister A  CV + Father CABG x 4 HBP + Mother DM - none Breast/Ovarian/Uterine cancer - none Depression + Mother ETOH/Drug - none Stroke - none  Social  History: Reviewed history from 03/06/2010 and no changes required. Marital Status: Married Children: 2,  Occupation: Programmer, systems, Water engineer, playing guitar quit smoking day of CABG, prev 3 PPD alcohol: 5-6 drinks in a week  Review of Systems       See HPI.  Otherwise negative.    Physical Exam  General:  GEN: nad, alert and oriented HEENT: mucous membranes moist NECK: supple w/o LA CV: rrr.  no murmur PULM: ctab, no inc wob ABD: soft, +bs EXT: no edema SKIN: no acute rash  Prostate:  Prostate gland firm and smooth, no enlargement, nodularity, tenderness, mass, asymmetry or induration.   Impression & Recommendations:  Problem # 1:  HEALTH MAINTENANCE EXAM (ICD-V70.0) I told pt that I would not rec any pt to use a phentermine product.  If he works on his diet and exercise, he'd lose the weight and wouldn't need the medicine.  If he took the medicine, lost weight, but didn't change diet/exercise, the medicine wouldn't do any good in the long run.  Also, the medicine may cause significant CV problems.  I offered cards eval to discuss and to est care.  He declined cards referral.  I encouraged diet/exercise.  COlonoscopy d/w patient vs. IFOB.  He is okay for IFOB with neg FB and no blood in stool.  Flu shot encouraged.    Problem # 2:  HYPERCHOLESTEROLEMIA, 180/LDL 122 (ICD-272.0) No change  in meds.  see above.  Labs d/w patient.  His updated medication list for this problem includes:    Simvastatin 40 Mg Tabs (Simvastatin) ..... One tab by mouth at night.  Problem # 3:  DIABETES MELLITUS, TYPE II (ICD-250.00) No change in meds.  see above.  Will notify patient about A1c.  His updated medication list for this problem includes:    Lisinopril 10 Mg Tabs (Lisinopril) .Marland Kitchen... Take one by mouth every morning    Bayer Low Strength 81 Mg Tbec (Aspirin) .Marland Kitchen... 1 daily by mouth  Orders: Venipuncture (62952) Specimen Handling (84132) TLB-A1C / Hgb A1C (Glycohemoglobin)  (83036-A1C)  Problem # 4:  ESSENTIAL HYPERTENSION, BENIGN (ICD-401.1) No change in meds.  see above.  Labs d/w patient.  He'll notify us about the med he cut in half.  His updated medication list for this problem includes:    Lisinopril 10 Mg Tabs (Lisinopril) .Marland Kitchen... Take one by mouth every morning    Metoprolol Tartrate 25 Mg Tabs (Metoprolol tartrate) .Marland Kitchen... 1 daily by mouth  Complete Medication List: 1)  Simvastatin 40 Mg Tabs (Simvastatin) .... One tab by mouth at night. 2)  Lisinopril 10 Mg Tabs (Lisinopril) .... Take one by mouth every morning 3)  Metoprolol Tartrate 25 Mg Tabs (Metoprolol tartrate) .Marland Kitchen.. 1 daily by mouth 4)  Proair Hfa 108 (90 Base) Mcg/act Aers (Albuterol sulfate) .... One puff every 4 hours as needed 5)  Bayer Low Strength 81 Mg Tbec (Aspirin) .Marland Kitchen.. 1 daily by mouth  Patient Instructions: 1)  Please call me back with an update on your meds- let us know which one you have been cutting in half.  2)  Try to get some exercise on a scheduled basis. 3)  I would get a flu shot.   4)  Take care.  Glad to see you. 5)  You can get your results through our phone system.  Follow the instructions on the blue card.  6)  Follow up appointment in 6months- OV with cmet/lipid/A1c before OV.  250.00.    Orders Added: 1)  Venipuncture [44010] 2)  Specimen Handling [99000] 3)  TLB-A1C / Hgb A1C (Glycohemoglobin) [83036-A1C] 4)  Est. Patient 40-64 years [99396] 5)  Est. Patient Level IV [27253]

## 2010-10-16 ENCOUNTER — Encounter (INDEPENDENT_AMBULATORY_CARE_PROVIDER_SITE_OTHER): Payer: Self-pay | Admitting: *Deleted

## 2010-10-21 NOTE — Letter (Signed)
Summary: Generic Letter  [place] at [facility]  [address] [place], [place] [address]   Phone: [phone]  Fax: [phone]    10/16/2010    JEN EPPINGER [address] [place], [place]  [address]  [place]    Dear Garrett Woodard,  We have been unable to reach you by phone.  If your phone number has changed, please notify our office as it is important that we be able to contact  you if necessary.   Because of the increase in your labs that were recently drawn, Dr. Para March would like to have lab work and an office visit with you sooner than what was previously scheduled.  We do still want you to keep your originally scheduled appointments in September also.  Appointments have been made for labs on 01/07/11 at 8:30 a.m. (nonfasting) and to see Dr. Para March on 01/13/11 at 8:15 a.m.    Original appointments for 04/13/11 at 8:20 a.m. for labs and to see Dr. Para March on 04/16/11 at 11:30 a.m.  Please call our office if these appointments are not suitable to your schedule.   Sincerely,   Garrett Woodard CMA (AAMA)

## 2010-10-22 ENCOUNTER — Other Ambulatory Visit: Payer: Self-pay | Admitting: Family Medicine

## 2010-10-23 NOTE — Telephone Encounter (Signed)
Dr. Lianne Bushy patient. Please refill.

## 2010-10-28 ENCOUNTER — Other Ambulatory Visit: Payer: BC Managed Care – PPO

## 2010-10-28 ENCOUNTER — Other Ambulatory Visit (INDEPENDENT_AMBULATORY_CARE_PROVIDER_SITE_OTHER): Payer: BC Managed Care – PPO | Admitting: Family Medicine

## 2010-10-28 DIAGNOSIS — Z1211 Encounter for screening for malignant neoplasm of colon: Secondary | ICD-10-CM

## 2010-10-28 LAB — FECAL OCCULT BLOOD, IMMUNOCHEMICAL: Fecal Occult Bld: NEGATIVE

## 2010-10-29 ENCOUNTER — Encounter: Payer: Self-pay | Admitting: *Deleted

## 2010-12-16 NOTE — Procedures (Signed)
NAME:  Garrett Woodard, CERVI NO.:  [id number]   MEDICAL RECORD NO.:  [id number]          PATIENT TYPE:  OUT   LOCATION:  SLEEP CENTER                 FACILITY:  [facility]   PHYSICIAN:  Oretha Milch, MD      DATE OF BIRTH:  10-10-1950   DATE OF STUDY:  01/03/2009                            NOCTURNAL POLYSOMNOGRAM   REFERRING PHYSICIAN:  Oretha Milch, MD   INDICATION FOR THE STUDY:  Excessive daytime somnolence and loud snoring  with witnessed apneas in this 60 year old gentleman with weight of 245  pounds, height of 6 feet 2 inches, BMI of 31, neck size 19 inch.   EPWORTH SLEEPINESS SCORE:  17.   BEDTIME MEDICATION:  Vytorin.   This intervention polysomnogram was performed with a sleep technologist  in attendance.  EEG, EOG, EMG, EKG, and respiratory parameters were  recorded 2 stages.  Arousals, limb movements, and respiratory data were  scored according to criteria laid out by the American Academy of Sleep  Medicine.   SLEEP ARCHITECTURE:  Lights out was at 9:15 p.m., lights on was at 5:15  a.m.  CPAP was initiated at 46 minutes past midnight.  During the  diagnostic portion, total sleep time was 97.5 minutes with a sleep  period time of 182 minutes and a sleep efficiency of 46%.  Sleep stages  of the percentage of total sleep time was N1 8 minutes and N2 90  minutes.  No slow wave or REM sleep was noted.  There were long periods  of awakening.  No supine sleep was noted.  During the titration portion  77.0 minutes of REM sleep was noted in 2 long periods around 2:30 a.m.  and 5:00 a.m.  Supine sleep was noted for 41 minute.   RESPIRATORY DATA:  During the diagnostic portion, there were 28  obstructive apneas, 3 central apnea, 0 mixed apneas, and 81 hypopneas  with an apnea-hypopnea index of 69 events per hour.  Seven RERAs were  noted.  Longest hypopnea was 30 seconds.  Due to this degree of  respiratory disturbance, CPAP was initiated at 4 cm with a large full-  face mask and titrated to a final level of 11 cm.  The level of 9 cm for  70 minutes including 38 minutes of REM sleep.  No events were noted with  the lowest desaturation of 93% at a level of 10 cm for 68 minutes  including 20 minutes of REM sleep, 1 hypopnea was noted with a lowest  desaturation of 92%, and a final level of 11 cm for 11.5 minutes of REM  sleep.  No events were noted.   AROUSAL DATA:  The arousal index during the diagnostic portion was 47  events per hour.  Very few arousals were noted even at the higher levels  of CPAP.   LIMB MOVEMENT DATA:  No significant limb movements were noted.   OXYGEN SATURATION DATA:  A desaturation index was 76 events per hour  with the lowest desaturation of 85% during the diagnostic portion.  A  low desaturation at the highest levels of BiPAP were 92%.   CARDIAC DATA:  An episode of tachycardia was noted on CPAP initiation.  The low heart rate was 39 beats per minute during the diagnostic  portion.  No arrhythmias were noted.   DISCUSSION:  He was desensitized with a large full-face mask.  He seemed  to tolerate the CPAP quite well and felt somewhat refreshed in the  morning after.   IMPRESSION:  1. Severe obstructive sleep apnea with hypopneas causing sleep      fragmentation and oxygen desaturation.  This was corrected by      continuous positive airway pressure of 11 cm with a large full-face      mask.  2. There is no evidence of cardiac arrhythmias, limb movements, or      behavioral disturbance during sleep.   RECOMMENDATIONS:  1. CPAP should be initiated the target of 11 cm with a large full-face      mask.  He should be monitored for compliance at this level.  2. He should be asked to avoid medications with sedative side effects      and he should be asked to avoid driving when sleepy.      Oretha Milch, MD  Electronically Signed     RVA/MEDQ  D:  01/03/2009 10:30:01  T:  01/03/2009 23:56:27  Job:  [id number]

## 2010-12-30 ENCOUNTER — Other Ambulatory Visit: Payer: Self-pay | Admitting: Family Medicine

## 2010-12-30 NOTE — Telephone Encounter (Signed)
This is now a pt of Dr Lianne Bushy.

## 2011-01-03 ENCOUNTER — Encounter: Payer: Self-pay | Admitting: Family Medicine

## 2011-01-07 ENCOUNTER — Other Ambulatory Visit: Payer: BC Managed Care – PPO

## 2011-01-13 ENCOUNTER — Ambulatory Visit: Payer: BC Managed Care – PPO | Admitting: Family Medicine

## 2011-01-15 ENCOUNTER — Encounter: Payer: Self-pay | Admitting: Family Medicine

## 2011-01-15 ENCOUNTER — Ambulatory Visit (INDEPENDENT_AMBULATORY_CARE_PROVIDER_SITE_OTHER): Payer: BC Managed Care – PPO | Admitting: Family Medicine

## 2011-01-15 VITALS — BP 118/70 | HR 72 | Temp 97.8°F | Ht 72.5 in | Wt 249.8 lb

## 2011-01-15 DIAGNOSIS — E119 Type 2 diabetes mellitus without complications: Secondary | ICD-10-CM

## 2011-01-15 NOTE — Progress Notes (Signed)
Diabetes: Using medications without difficulties:no meds, due for labs.  losng weight on nutrasystem.   Hypoglycemic episodes: not checked, but no symptoms Hyperglycemic episodes: not checked, but no symptoms Feet problems: no Blood Sugars averaging: not checked eye exam within last year: d/w pt.  This was encouraged.    PMH and SH reviewed  Meds, vitals, and allergies reviewed.   ROS: See HPI.  Otherwise negative.    GEN: nad, alert and oriented HEENT: mucous membranes moist NECK: supple w/o LA CV: rrr. PULM: ctab, no inc wob ABD: soft, +bs EXT: no edema SKIN: no acute rash

## 2011-01-15 NOTE — Assessment & Plan Note (Signed)
>  25 min spent with face to face with patient >50% counseling.  Long discussion with pt about mortality/morbidity with uncontrolled DM2.  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  He is losing weight.  Will check A1c today and notify pt about f/u.

## 2011-01-15 NOTE — Progress Notes (Signed)
Addended by: Lars Mage on: 01/15/2011 01:58 PM   Modules accepted: Orders

## 2011-01-15 NOTE — Patient Instructions (Signed)
You can get your results through our phone system.  Follow the instructions on the blue card. Keep working on your weight.  Take care.

## 2011-03-16 ENCOUNTER — Telehealth: Payer: Self-pay | Admitting: *Deleted

## 2011-03-16 NOTE — Telephone Encounter (Signed)
This should come through pulmonary.  Please send to pulm office. Thanks.

## 2011-03-16 NOTE — Telephone Encounter (Signed)
Patient is at [facility] requesting CPAP supplies.  Can they please have an order faxed to them for these supplies?  FAX  [phone]

## 2011-03-17 NOTE — Telephone Encounter (Signed)
LMOM for pt TCB to schedule OV with RA.  Will send order to [facility] for cpap supplies once I talk with pt and schedule OV.

## 2011-03-17 NOTE — Telephone Encounter (Signed)
He has not seen [place] since 7/10 OK to get supplies but needs appt in the next month

## 2011-04-13 ENCOUNTER — Other Ambulatory Visit (INDEPENDENT_AMBULATORY_CARE_PROVIDER_SITE_OTHER): Payer: BC Managed Care – PPO

## 2011-04-13 DIAGNOSIS — E119 Type 2 diabetes mellitus without complications: Secondary | ICD-10-CM

## 2011-04-13 LAB — LIPID PANEL
Cholesterol: 146 mg/dL (ref 0–200)
Triglycerides: 113 mg/dL (ref 0.0–149.0)

## 2011-04-13 LAB — HEMOGLOBIN A1C: Hgb A1c MFr Bld: 7 % — ABNORMAL HIGH (ref 4.6–6.5)

## 2011-04-16 ENCOUNTER — Ambulatory Visit: Payer: BC Managed Care – PPO | Admitting: Family Medicine

## 2011-04-30 ENCOUNTER — Ambulatory Visit: Payer: BC Managed Care – PPO | Admitting: Family Medicine

## 2011-05-11 ENCOUNTER — Other Ambulatory Visit: Payer: Self-pay | Admitting: Family Medicine

## 2011-10-27 ENCOUNTER — Other Ambulatory Visit: Payer: Self-pay | Admitting: *Deleted

## 2011-10-27 ENCOUNTER — Other Ambulatory Visit: Payer: Self-pay | Admitting: Family Medicine

## 2011-11-26 ENCOUNTER — Other Ambulatory Visit: Payer: Self-pay | Admitting: Family Medicine

## 2011-11-26 DIAGNOSIS — E119 Type 2 diabetes mellitus without complications: Secondary | ICD-10-CM

## 2011-11-26 DIAGNOSIS — Z125 Encounter for screening for malignant neoplasm of prostate: Secondary | ICD-10-CM

## 2011-11-30 ENCOUNTER — Other Ambulatory Visit (INDEPENDENT_AMBULATORY_CARE_PROVIDER_SITE_OTHER): Payer: BC Managed Care – PPO

## 2011-11-30 DIAGNOSIS — Z125 Encounter for screening for malignant neoplasm of prostate: Secondary | ICD-10-CM

## 2011-11-30 DIAGNOSIS — E119 Type 2 diabetes mellitus without complications: Secondary | ICD-10-CM

## 2011-11-30 LAB — COMPREHENSIVE METABOLIC PANEL
BUN: 16 mg/dL (ref 6–23)
CO2: 26 mEq/L (ref 19–32)
Calcium: 9.3 mg/dL (ref 8.4–10.5)
Chloride: 103 mEq/L (ref 96–112)
Creatinine, Ser: 0.8 mg/dL (ref 0.4–1.5)
GFR: 111.71 mL/min (ref >60.00)
Glucose, Bld: 173 mg/dL — ABNORMAL HIGH (ref 70–99)
Total Bilirubin: 0.5 mg/dL (ref 0.3–1.2)

## 2011-11-30 LAB — LIPID PANEL
Cholesterol: 160 mg/dL (ref 0–200)
HDL: 54.3 mg/dL (ref >39.00)
Triglycerides: 112 mg/dL (ref 0.0–149.0)

## 2011-11-30 LAB — HEMOGLOBIN A1C: Hgb A1c MFr Bld: 7.8 % — ABNORMAL HIGH (ref 4.6–6.5)

## 2011-11-30 LAB — TSH: TSH: 2.46 u[IU]/mL (ref 0.35–5.50)

## 2011-12-08 ENCOUNTER — Ambulatory Visit (INDEPENDENT_AMBULATORY_CARE_PROVIDER_SITE_OTHER): Payer: BC Managed Care – PPO | Admitting: Family Medicine

## 2011-12-08 ENCOUNTER — Encounter: Payer: Self-pay | Admitting: Family Medicine

## 2011-12-08 VITALS — BP 112/74 | HR 83 | Temp 98.5°F | Wt 254.0 lb

## 2011-12-08 DIAGNOSIS — E039 Hypothyroidism, unspecified: Secondary | ICD-10-CM

## 2011-12-08 DIAGNOSIS — Z1211 Encounter for screening for malignant neoplasm of colon: Secondary | ICD-10-CM

## 2011-12-08 DIAGNOSIS — I10 Essential (primary) hypertension: Secondary | ICD-10-CM

## 2011-12-08 DIAGNOSIS — E119 Type 2 diabetes mellitus without complications: Secondary | ICD-10-CM

## 2011-12-08 DIAGNOSIS — Z0000 Encounter for general adult medical examination without abnormal findings: Secondary | ICD-10-CM

## 2011-12-08 DIAGNOSIS — E7800 Pure hypercholesterolemia, unspecified: Secondary | ICD-10-CM

## 2011-12-08 DIAGNOSIS — E669 Obesity, unspecified: Secondary | ICD-10-CM

## 2011-12-08 DIAGNOSIS — G4730 Sleep apnea, unspecified: Secondary | ICD-10-CM

## 2011-12-08 MED ORDER — METOPROLOL TARTRATE 25 MG PO TABS: 12.5000 mg | ORAL_TABLET | Freq: Every day | ORAL | Status: DC

## 2011-12-08 MED ORDER — LISINOPRIL 10 MG PO TABS: 10.0000 mg | ORAL_TABLET | Freq: Every day | ORAL | Status: DC

## 2011-12-08 NOTE — Progress Notes (Signed)
CPE- See plan.  Routine anticipatory guidance given to patient.  See health maintenance. Td 2006 Flu shot encouraged PSA wnl D/w patient [id number] for colon cancer screening, including IFOB vs. colonoscopy.  Risks and benefits of both were discussed and patient voiced understanding.  Pt elects for: IFOB.    CPAP per pulm.  Doing well with it, notices a difference if he skips it.  Sleep is much improved.    Diabetes:  No meds Hypoglycemic episodes: not checked, no sx Hyperglycemic episodes: not checked, no sx Feet problems: no Blood Sugars averaging:  not checked  On low carb diet.  A1c is up and he'll work on his diet/weight.  Plan to recheck A1c in 3 months.   Hypertension:    Using medication without problems or lightheadedness: yes Chest pain with exertion:no Edema:no Short of breath:no  Elevated Cholesterol: Using medications without problems:yes Muscle aches: no Diet compliance:yes Exercise: 'not enough'  PMH and SH reviewed.   Vital signs, Meds and allergies reviewed.  ROS: See HPI.  Otherwise nontributory.   GEN: nad, alert and oriented HEENT: mucous membranes moist NECK: supple w/o LA CV: rrr.  no murmur PULM: ctab, no inc wob ABD: soft, +bs EXT: no edema SKIN: no acute rash  Diabetic foot exam: Normal inspection No skin breakdown No calluses  Normal DP pulses Normal sensation to light tough and monofilament Nails normal

## 2011-12-08 NOTE — Patient Instructions (Addendum)
Go to the lab on the way out.  I would get a flu shot each fall.   Recheck A1c in 3 months and then come see me after that.  Work on your [employer] in the meantime.   Take care.

## 2011-12-10 DIAGNOSIS — E7800 Pure hypercholesterolemia, unspecified: Secondary | ICD-10-CM | POA: Insufficient documentation

## 2011-12-10 DIAGNOSIS — Z0000 Encounter for general adult medical examination without abnormal findings: Secondary | ICD-10-CM | POA: Insufficient documentation

## 2011-12-10 NOTE — Assessment & Plan Note (Signed)
Controlled, needs to work on diet and exercise.

## 2011-12-10 NOTE — Assessment & Plan Note (Signed)
Needs to lose weight.  He wants a diet pill.  He needs to diet and exercise.  Labs d/w pt.  Recheck later in 2013.

## 2011-12-10 NOTE — Assessment & Plan Note (Signed)
Routine anticipatory guidance given to patient.  See health maintenance. Td 2006 Flu shot encouraged PSA wnl D/w patient [id number] for colon cancer screening, including IFOB vs. colonoscopy.  Risks and benefits of both were discussed and patient voiced understanding.  Pt elects for: IFOB.

## 2011-12-10 NOTE — Assessment & Plan Note (Signed)
Needs diet and exercise. 

## 2011-12-10 NOTE — Assessment & Plan Note (Signed)
Continue CPAP.  

## 2011-12-10 NOTE — Assessment & Plan Note (Signed)
tsh wnl, resolved.

## 2011-12-10 NOTE — Assessment & Plan Note (Signed)
Controlled, d/w pt about weight.

## 2011-12-23 ENCOUNTER — Encounter: Payer: Self-pay | Admitting: *Deleted

## 2011-12-23 ENCOUNTER — Other Ambulatory Visit: Payer: BC Managed Care – PPO

## 2011-12-23 DIAGNOSIS — Z1211 Encounter for screening for malignant neoplasm of colon: Secondary | ICD-10-CM

## 2011-12-30 ENCOUNTER — Other Ambulatory Visit: Payer: BC Managed Care – PPO

## 2012-01-21 ENCOUNTER — Other Ambulatory Visit: Payer: Self-pay | Admitting: Family Medicine

## 2012-03-08 ENCOUNTER — Other Ambulatory Visit (INDEPENDENT_AMBULATORY_CARE_PROVIDER_SITE_OTHER): Payer: BC Managed Care – PPO

## 2012-03-08 DIAGNOSIS — E119 Type 2 diabetes mellitus without complications: Secondary | ICD-10-CM

## 2012-03-08 LAB — HEMOGLOBIN A1C: Hgb A1c MFr Bld: 7.3 % — ABNORMAL HIGH (ref 4.6–6.5)

## 2012-03-15 ENCOUNTER — Ambulatory Visit: Payer: BC Managed Care – PPO | Admitting: Family Medicine

## 2012-03-15 ENCOUNTER — Ambulatory Visit (INDEPENDENT_AMBULATORY_CARE_PROVIDER_SITE_OTHER): Payer: BC Managed Care – PPO | Admitting: Family Medicine

## 2012-03-15 ENCOUNTER — Encounter: Payer: Self-pay | Admitting: Family Medicine

## 2012-03-15 VITALS — BP 122/72 | HR 85 | Temp 98.4°F | Wt 255.0 lb

## 2012-03-15 DIAGNOSIS — E119 Type 2 diabetes mellitus without complications: Secondary | ICD-10-CM

## 2012-03-15 NOTE — Assessment & Plan Note (Signed)
Continue work on diet and weight.  Will check A1c periodically.  D/w pt.  He agrees.

## 2012-03-15 NOTE — Progress Notes (Signed)
Diabetes:  Using medications without difficulties: no meds Hypoglycemic episodes: not checked Hyperglycemic episodes: not checked Feet problems: no Blood Sugars averaging: not checked eye exam within last year: due, discussed.   A1c improved to 7.3.  Weight stable.   He's working on low carb diet.   Compliant with CPAP. D/w pt about weight.  He's working on this.    We again talked about weight loss supplements.  I would not pursue this with currently available options.    Meds, vitals, and allergies reviewed.   ROS: See HPI.  Otherwise negative.    GEN: nad, alert and oriented HEENT: mucous membranes moist NECK: supple w/o LA CV: rrr. PULM: ctab, no inc wob ABD: soft, +bs EXT: no edema SKIN: no acute rash  Diabetic foot exam: Normal inspection No skin breakdown No calluses  Normal DP pulses Normal sensation to light touch and monofilament Nails normal

## 2012-03-15 NOTE — Patient Instructions (Addendum)
Call about your eye exam.  Recheck labs in 1/14 with a visit a few days later.  Take care.

## 2012-08-16 ENCOUNTER — Other Ambulatory Visit (INDEPENDENT_AMBULATORY_CARE_PROVIDER_SITE_OTHER): Payer: BC Managed Care – PPO

## 2012-08-16 DIAGNOSIS — E119 Type 2 diabetes mellitus without complications: Secondary | ICD-10-CM

## 2012-08-19 ENCOUNTER — Encounter: Payer: Self-pay | Admitting: Family Medicine

## 2012-08-19 ENCOUNTER — Ambulatory Visit (INDEPENDENT_AMBULATORY_CARE_PROVIDER_SITE_OTHER): Payer: BC Managed Care – PPO | Admitting: Family Medicine

## 2012-08-19 VITALS — BP 116/70 | HR 84 | Temp 98.2°F | Ht 72.5 in | Wt 254.0 lb

## 2012-08-19 DIAGNOSIS — E7800 Pure hypercholesterolemia, unspecified: Secondary | ICD-10-CM

## 2012-08-19 DIAGNOSIS — I10 Essential (primary) hypertension: Secondary | ICD-10-CM

## 2012-08-19 DIAGNOSIS — E119 Type 2 diabetes mellitus without complications: Secondary | ICD-10-CM

## 2012-08-19 NOTE — Patient Instructions (Addendum)
I would read up on metformin.   Weight watchers can go a lot of good.  I hope it goes well.   Recheck A1c and other labs before a visit in about 3 months.

## 2012-08-19 NOTE — Progress Notes (Signed)
Work has continued to be stressful.  Some personal changes have been difficult over the last year.  Playing guitar, some.  This is relaxing for him.    Diabetes:  No meds Hypoglycemic episodes: no sx Hyperglycemic episodes: no sx Feet problems: no Blood Sugars averaging: not checked He's thinking about weight watchers.   A1c d/w pt.   Hypertension:    Using medication without problems or lightheadedness: yes Chest pain with exertion:no Edema:no Short of breath:no  Elevated Cholesterol: Using medications without problems: yes Muscle aches: no Diet compliance: discussed Exercise: discussed, none other than walking at work  [facility] and SH reviewed.   Vital signs, Meds and allergies reviewed.  ROS: See HPI.  Otherwise nontributory.   GEN: nad, alert and oriented HEENT: mucous membranes moist NECK: supple w/o LA CV: rrr.  no murmur PULM: ctab, no inc wob ABD: soft, +bs EXT: no edema SKIN: no acute rash  Diabetic foot exam: Normal inspection No skin breakdown No calluses  Normal DP pulses Normal sensation to light tough and monofilament Nails normal

## 2012-08-21 NOTE — Assessment & Plan Note (Signed)
Will continue meds and work on weight.  D/w pt.

## 2012-08-21 NOTE — Assessment & Plan Note (Signed)
Controlled, continue meds and work on weight.

## 2012-08-21 NOTE — Assessment & Plan Note (Signed)
Uncontrolled, d/w pt about metformin with diet/exercise/weight loss.  He wants to defer meds, despite advice to start metformin.  Recheck labs in 3 months.  He admits to having diet altered over the holidays, end of the year.

## 2012-11-03 ENCOUNTER — Other Ambulatory Visit: Payer: Self-pay | Admitting: Family Medicine

## 2012-11-17 ENCOUNTER — Other Ambulatory Visit (INDEPENDENT_AMBULATORY_CARE_PROVIDER_SITE_OTHER): Payer: BC Managed Care – PPO

## 2012-11-17 DIAGNOSIS — E119 Type 2 diabetes mellitus without complications: Secondary | ICD-10-CM

## 2012-11-17 LAB — LIPID PANEL
Cholesterol: 109 mg/dL (ref 0–200)
HDL: 44.7 mg/dL (ref >39.00)
LDL Cholesterol: 51 mg/dL (ref 0–99)
Triglycerides: 69 mg/dL (ref 0.0–149.0)
VLDL: 13.8 mg/dL (ref 0.0–40.0)

## 2012-11-17 LAB — COMPREHENSIVE METABOLIC PANEL
AST: 24 U/L (ref 0–37)
Alkaline Phosphatase: 60 U/L (ref 39–117)
BUN: 14 mg/dL (ref 6–23)
Calcium: 9.7 mg/dL (ref 8.4–10.5)
Creatinine, Ser: 1 mg/dL (ref 0.4–1.5)

## 2012-11-22 ENCOUNTER — Encounter: Payer: Self-pay | Admitting: Family Medicine

## 2012-11-22 ENCOUNTER — Ambulatory Visit (INDEPENDENT_AMBULATORY_CARE_PROVIDER_SITE_OTHER): Payer: BC Managed Care – PPO | Admitting: Family Medicine

## 2012-11-22 VITALS — BP 102/62 | HR 66 | Temp 97.3°F | Wt 231.8 lb

## 2012-11-22 DIAGNOSIS — E1165 Type 2 diabetes mellitus with hyperglycemia: Secondary | ICD-10-CM

## 2012-11-22 DIAGNOSIS — E7800 Pure hypercholesterolemia, unspecified: Secondary | ICD-10-CM

## 2012-11-22 DIAGNOSIS — E119 Type 2 diabetes mellitus without complications: Secondary | ICD-10-CM

## 2012-11-22 DIAGNOSIS — IMO0002 Reserved for concepts with insufficient information to code with codable children: Secondary | ICD-10-CM

## 2012-11-22 DIAGNOSIS — IMO0001 Reserved for inherently not codable concepts without codable children: Secondary | ICD-10-CM

## 2012-11-22 MED ORDER — SIMVASTATIN 40 MG PO TABS: 20.0000 mg | ORAL_TABLET | Freq: Every day | ORAL | Status: DC

## 2012-11-22 NOTE — Patient Instructions (Addendum)
Keep working on your weight.  Thanks for your effort.   Recheck labs in 3 months then come see me again.   Cut the simvastatin back to 20mg  at night. If you get lightheaded, then cut the lisinopril in half and notify me.   Take care.

## 2012-11-22 NOTE — Progress Notes (Signed)
Diabetes:  Using medications without difficulties:no meds Hypoglycemic episodes:not checked Hyperglycemic episodes:not checked Feet problems: no Blood Sugars averaging: not checked He is going through [employer].   He is losing weight and managing his diet.   A1c is much improved.  Discussed and congratulated.   He feels much better.   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    He isn't light headed.  He doesn't have cramps now.  His lipids are much improved.   Meds, vitals, and allergies reviewed.   ROS: See HPI.  Otherwise negative.    GEN: nad, alert and oriented HEENT: mucous membranes moist NECK: supple w/o LA CV: rrr. PULM: ctab, no inc wob ABD: soft, +bs EXT: no edema SKIN: no acute rash

## 2012-11-23 NOTE — Assessment & Plan Note (Addendum)
Much improved and congratulated.  Continue work on diet and exercise, weight.  Recheck later in 2014.  He agrees.

## 2012-11-23 NOTE — Assessment & Plan Note (Signed)
Much improved.  Continue work on diet and exercise, weight.  Recheck later in 2014.  Would cut statin in meantime. He agrees.

## 2013-02-09 ENCOUNTER — Other Ambulatory Visit: Payer: Self-pay

## 2013-02-13 ENCOUNTER — Other Ambulatory Visit: Payer: Self-pay | Admitting: Family Medicine

## 2013-02-15 ENCOUNTER — Other Ambulatory Visit (INDEPENDENT_AMBULATORY_CARE_PROVIDER_SITE_OTHER): Payer: BC Managed Care – PPO

## 2013-02-15 DIAGNOSIS — Z0000 Encounter for general adult medical examination without abnormal findings: Secondary | ICD-10-CM

## 2013-02-15 DIAGNOSIS — E119 Type 2 diabetes mellitus without complications: Secondary | ICD-10-CM

## 2013-02-15 DIAGNOSIS — E7800 Pure hypercholesterolemia, unspecified: Secondary | ICD-10-CM

## 2013-02-15 DIAGNOSIS — I10 Essential (primary) hypertension: Secondary | ICD-10-CM

## 2013-02-15 LAB — LIPID PANEL
Cholesterol: 126 mg/dL (ref 0–200)
LDL Cholesterol: 54 mg/dL (ref 0–99)
Triglycerides: 97 mg/dL (ref 0.0–149.0)
VLDL: 19.4 mg/dL (ref 0.0–40.0)

## 2013-02-23 ENCOUNTER — Encounter: Payer: Self-pay | Admitting: Family Medicine

## 2013-02-23 ENCOUNTER — Ambulatory Visit (INDEPENDENT_AMBULATORY_CARE_PROVIDER_SITE_OTHER): Payer: BC Managed Care – PPO | Admitting: Family Medicine

## 2013-02-23 VITALS — BP 114/60 | HR 67 | Temp 98.0°F | Ht 74.0 in | Wt 227.5 lb

## 2013-02-23 DIAGNOSIS — E119 Type 2 diabetes mellitus without complications: Secondary | ICD-10-CM

## 2013-02-23 DIAGNOSIS — E1165 Type 2 diabetes mellitus with hyperglycemia: Secondary | ICD-10-CM

## 2013-02-23 DIAGNOSIS — Z0000 Encounter for general adult medical examination without abnormal findings: Secondary | ICD-10-CM

## 2013-02-23 DIAGNOSIS — Z1211 Encounter for screening for malignant neoplasm of colon: Secondary | ICD-10-CM

## 2013-02-23 DIAGNOSIS — IMO0002 Reserved for concepts with insufficient information to code with codable children: Secondary | ICD-10-CM

## 2013-02-23 DIAGNOSIS — IMO0001 Reserved for inherently not codable concepts without codable children: Secondary | ICD-10-CM

## 2013-02-23 DIAGNOSIS — I10 Essential (primary) hypertension: Secondary | ICD-10-CM

## 2013-02-23 DIAGNOSIS — E7800 Pure hypercholesterolemia, unspecified: Secondary | ICD-10-CM

## 2013-02-23 MED ORDER — METOPROLOL TARTRATE 25 MG PO TABS: 12.5000 mg | ORAL_TABLET | Freq: Every day | ORAL | Status: DC

## 2013-02-23 MED ORDER — LISINOPRIL 10 MG PO TABS: 10.0000 mg | ORAL_TABLET | Freq: Every day | ORAL | Status: DC

## 2013-02-23 MED ORDER — SIMVASTATIN 40 MG PO TABS: 20.0000 mg | ORAL_TABLET | Freq: Every day | ORAL | Status: DC

## 2013-02-23 MED ORDER — NEOMYCIN-POLYMYXIN-HC 3.5-10000-1 OT SOLN: 3.0000 [drp] | Freq: Four times a day (QID) | OTIC | Status: DC

## 2013-02-23 NOTE — Patient Instructions (Addendum)
Go to the lab on the way out.  We'll contact you with your lab report.  Use the drops in the left ear, change/clean your earplugs frequently.   Check with your insurance to see if they will cover the shingles shot. I would get it and the pneumonia shot when you turn 62.  I would get a flu shot each fall.   See about getting an eye exam

## 2013-02-23 NOTE — Progress Notes (Signed)
CPE- See plan.  Routine anticipatory guidance given to patient.  See health maintenance. Tetanus 2006 Flu shot 2013, encouraged Shingles shot at 62, discussed D/w pt about PNA vaccine.   D/w patient [id number] for colon cancer screening, including IFOB vs. colonoscopy.  Risks and benefits of both were discussed and patient voiced understanding.  Pt elects for: IFOB.   Diet and exercise d/w pt.   Prostate cancer screening and PSA options (with potential risks and benefits of testing vs not testing) were discussed along with recent recs/guidelines.  He declined testing PSA at this point.  Living will d/w pt.  Would have wife designated if incapacitated.    Diabetes:  No meds Hypoglycemic episodes:no sx Hyperglycemic episodes:no sx Feet problems:no Blood Sugars averaging:not checked eye exam within last year: due, he'll check on this  Hypertension:    Using medication without problems or lightheadedness: yes Chest pain with exertion:no Edema:no Short of breath:no  Elevated Cholesterol: Using medications without problems:yes Muscle aches: no Diet compliance:yes Exercise:yes  He is still going to weight watchers.    PMH and SH reviewed.   Vital signs, Meds and allergies reviewed.  ROS: See HPI.  Otherwise nontributory.   GEN: nad, alert and oriented HEENT: mucous membranes moist, L canal with AOE, TM intact, R canal and TM wnl NECK: supple w/o LA CV: rrr. PULM: ctab, no inc wob ABD: soft, +bs EXT: no edema SKIN: no acute rash  Diabetic foot exam: Normal inspection No skin breakdown No calluses  Normal DP pulses Normal sensation to light tough and monofilament Nails normal

## 2013-02-24 NOTE — Assessment & Plan Note (Signed)
Routine anticipatory guidance given to patient.  See health maintenance. Tetanus 2006 Flu shot 2013, encouraged Shingles shot at 62, discussed D/w pt about PNA vaccine.   D/w patient [id number] for colon cancer screening, including IFOB vs. colonoscopy.  Risks and benefits of both were discussed and patient voiced understanding.  Pt elects for: IFOB.   Diet and exercise d/w pt.   Prostate cancer screening and PSA options (with potential risks and benefits of testing vs not testing) were discussed along with recent recs/guidelines.  He declined testing PSA at this point.  Living will d/w pt.  Would have wife designated if incapacitated.

## 2013-02-24 NOTE — Assessment & Plan Note (Signed)
Controlled, continue to work on weight, continue meds.

## 2013-02-24 NOTE — Assessment & Plan Note (Signed)
Much improved, continue off meds. Doing well.  Labs discussed.

## 2013-02-24 NOTE — Assessment & Plan Note (Signed)
Controlled, continue as is.  Discussed.

## 2013-03-07 ENCOUNTER — Encounter: Payer: Self-pay | Admitting: *Deleted

## 2013-03-07 ENCOUNTER — Other Ambulatory Visit (INDEPENDENT_AMBULATORY_CARE_PROVIDER_SITE_OTHER): Payer: BC Managed Care – PPO

## 2013-03-07 DIAGNOSIS — Z1211 Encounter for screening for malignant neoplasm of colon: Secondary | ICD-10-CM

## 2013-06-08 ENCOUNTER — Other Ambulatory Visit (INDEPENDENT_AMBULATORY_CARE_PROVIDER_SITE_OTHER): Payer: BC Managed Care – PPO

## 2013-06-08 DIAGNOSIS — E119 Type 2 diabetes mellitus without complications: Secondary | ICD-10-CM

## 2013-06-13 ENCOUNTER — Encounter: Payer: Self-pay | Admitting: Family Medicine

## 2013-06-13 ENCOUNTER — Ambulatory Visit (INDEPENDENT_AMBULATORY_CARE_PROVIDER_SITE_OTHER): Payer: BC Managed Care – PPO | Admitting: Family Medicine

## 2013-06-13 VITALS — BP 116/70 | HR 75 | Temp 97.8°F | Wt 235.5 lb

## 2013-06-13 DIAGNOSIS — H6092 Unspecified otitis externa, left ear: Secondary | ICD-10-CM

## 2013-06-13 DIAGNOSIS — H60399 Other infective otitis externa, unspecified ear: Secondary | ICD-10-CM

## 2013-06-13 DIAGNOSIS — E119 Type 2 diabetes mellitus without complications: Secondary | ICD-10-CM

## 2013-06-13 DIAGNOSIS — Z23 Encounter for immunization: Secondary | ICD-10-CM

## 2013-06-13 MED ORDER — NEOMYCIN-POLYMYXIN-HC 3.5-10000-1 OT SOLN: 3.0000 [drp] | Freq: Four times a day (QID) | OTIC | Status: DC

## 2013-06-13 NOTE — Patient Instructions (Addendum)
Check with your insurance to see if they will cover the shingles shot. Recheck your labs in spring of 2015 before a visit.  Take care. Keep working on your weight.  Glad to see you.  Check on an eye exam.

## 2013-06-13 NOTE — Progress Notes (Signed)
Pre-visit discussion using our clinic review tool. No additional management support is needed unless otherwise documented below in the visit note.   Still with occ L ear drainage.  Hasn't used drops in the last few days.  Feels like he has an ear plug.  Recently with hearing checked, normal per patient.   Diabetes:  No meds Hypoglycemic episodes:no sx Hyperglycemic episodes:no sx Feet problems:no Blood Sugars averaging: not checked eye exam within last year: pending- encouraged f/u.  Still working on diet and exercising.   A1c stable below 7.   Still playing guitar and doing well.    Meds, vitals, and allergies reviewed.   ROS: See HPI.  Otherwise negative.    GEN: nad, alert and oriented HEENT: mucous membranes moist, R TM and canal wnl.  L TM with purulent discharge but pinna and mastoid not ttp NECK: supple w/o LA CV: rrr. PULM: ctab, no inc wob ABD: soft, +bs EXT: no edema SKIN: no acute rash  Diabetic foot exam: Normal inspection No skin breakdown No calluses  Normal DP pulses Normal sensation to light touch and monofilament Nails normal

## 2013-06-14 DIAGNOSIS — H60509 Unspecified acute noninfective otitis externa, unspecified ear: Secondary | ICD-10-CM | POA: Insufficient documentation

## 2013-06-14 NOTE — Assessment & Plan Note (Signed)
Restart abx drops, doesn't appear to need a wick and if not improve he'll let me know.  He does wear ear plugs.  Advised to change out, keep them clean. We can get him to ENT if needed.

## 2013-06-14 NOTE — Assessment & Plan Note (Signed)
Diet controlled, continue weight loss work and we'll recheck in 2015.  Advised against weight loss meds as he is making changes with weight and lifestyle o/w.  Labs dw pt.

## 2013-11-06 ENCOUNTER — Other Ambulatory Visit: Payer: Self-pay | Admitting: Family Medicine

## 2013-11-06 DIAGNOSIS — E119 Type 2 diabetes mellitus without complications: Secondary | ICD-10-CM

## 2013-11-08 ENCOUNTER — Other Ambulatory Visit (INDEPENDENT_AMBULATORY_CARE_PROVIDER_SITE_OTHER): Payer: BC Managed Care – PPO

## 2013-11-08 DIAGNOSIS — E119 Type 2 diabetes mellitus without complications: Secondary | ICD-10-CM

## 2013-11-08 LAB — HEMOGLOBIN A1C: HEMOGLOBIN A1C: 7.3 % — AB (ref 4.6–6.5)

## 2013-11-13 ENCOUNTER — Encounter: Payer: Self-pay | Admitting: Family Medicine

## 2013-11-13 ENCOUNTER — Ambulatory Visit (INDEPENDENT_AMBULATORY_CARE_PROVIDER_SITE_OTHER): Payer: BC Managed Care – PPO | Admitting: Family Medicine

## 2013-11-13 VITALS — BP 108/74 | HR 80 | Temp 97.7°F | Wt 242.8 lb

## 2013-11-13 DIAGNOSIS — E119 Type 2 diabetes mellitus without complications: Secondary | ICD-10-CM

## 2013-11-13 NOTE — Progress Notes (Signed)
Pre visit review using our clinic review tool, if applicable. No additional management support is needed unless otherwise documented below in the visit note.  Diabetes:  No meds.  Hypoglycemic episodes:no sx Hyperglycemic episodes:no sx Feet problems: no Blood Sugars averaging: not checked.   eye exam within last year: due.  D/w pt.   He's in the middle of moving his work place.  That and the holidays likely caused the mild inc in A1c.  He's working 7 days a week, "only when awake", ie all the time. 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 Work should settle down in 01/2014. He's still working on a low carb diet.    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  Meds, vitals, and allergies reviewed.   ROS: See HPI.  Otherwise negative.    GEN: nad, alert and oriented HEENT: mucous membranes moist NECK: supple w/o LA CV: rrr. PULM: ctab, no inc wob ABD: soft, +bs EXT: no edema SKIN: no acute rash

## 2013-11-13 NOTE — Patient Instructions (Signed)
Schedule a physical for 03/2014.  Labs ahead of time.  Try to work on your diet in the meantime.

## 2013-11-14 ENCOUNTER — Other Ambulatory Visit: Payer: Self-pay | Admitting: Family Medicine

## 2013-11-14 NOTE — Assessment & Plan Note (Signed)
No change in meds.  He'll work on diet and weight as work allows and we'll recheck later in the year.  He agrees.

## 2013-11-27 ENCOUNTER — Telehealth: Payer: Self-pay

## 2013-11-27 NOTE — Telephone Encounter (Signed)
Relevant patient education assigned to patient using Emmi. ° °

## 2014-03-11 ENCOUNTER — Other Ambulatory Visit: Payer: Self-pay | Admitting: Family Medicine

## 2014-03-11 DIAGNOSIS — E119 Type 2 diabetes mellitus without complications: Secondary | ICD-10-CM

## 2014-03-13 ENCOUNTER — Other Ambulatory Visit (INDEPENDENT_AMBULATORY_CARE_PROVIDER_SITE_OTHER): Payer: BC Managed Care – PPO

## 2014-03-13 DIAGNOSIS — E119 Type 2 diabetes mellitus without complications: Secondary | ICD-10-CM

## 2014-03-13 LAB — COMPREHENSIVE METABOLIC PANEL
ALBUMIN: 4.2 g/dL (ref 3.5–5.2)
ALT: 34 U/L (ref 0–53)
AST: 22 U/L (ref 0–37)
Alkaline Phosphatase: 56 U/L (ref 39–117)
BILIRUBIN TOTAL: 0.8 mg/dL (ref 0.2–1.2)
BUN: 15 mg/dL (ref 6–23)
CO2: 24 mEq/L (ref 19–32)
Calcium: 9.4 mg/dL (ref 8.4–10.5)
Chloride: 103 mEq/L (ref 96–112)
Creatinine, Ser: 0.9 mg/dL (ref 0.4–1.5)
GFR: 92.38 mL/min (ref >60.00)
Glucose, Bld: 176 mg/dL — ABNORMAL HIGH (ref 70–99)
POTASSIUM: 4.7 meq/L (ref 3.5–5.1)
Sodium: 136 mEq/L (ref 135–145)
TOTAL PROTEIN: 7.4 g/dL (ref 6.0–8.3)

## 2014-03-13 LAB — HEMOGLOBIN A1C: Hgb A1c MFr Bld: 7.9 % — ABNORMAL HIGH (ref 4.6–6.5)

## 2014-03-13 LAB — LIPID PANEL
CHOL/HDL RATIO: 4
Cholesterol: 201 mg/dL — ABNORMAL HIGH (ref 0–200)
HDL: 54.2 mg/dL (ref >39.00)
LDL CALC: 118 mg/dL — AB (ref 0–99)
NonHDL: 146.8
Triglycerides: 146 mg/dL (ref 0.0–149.0)
VLDL: 29.2 mg/dL (ref 0.0–40.0)

## 2014-03-16 ENCOUNTER — Ambulatory Visit (INDEPENDENT_AMBULATORY_CARE_PROVIDER_SITE_OTHER): Payer: BC Managed Care – PPO | Admitting: Family Medicine

## 2014-03-16 ENCOUNTER — Encounter: Payer: Self-pay | Admitting: Family Medicine

## 2014-03-16 VITALS — BP 108/70 | HR 80 | Temp 98.1°F | Ht 74.0 in | Wt 252.2 lb

## 2014-03-16 DIAGNOSIS — Z0000 Encounter for general adult medical examination without abnormal findings: Secondary | ICD-10-CM

## 2014-03-16 DIAGNOSIS — E7800 Pure hypercholesterolemia, unspecified: Secondary | ICD-10-CM

## 2014-03-16 DIAGNOSIS — E119 Type 2 diabetes mellitus without complications: Secondary | ICD-10-CM

## 2014-03-16 DIAGNOSIS — Z7189 Other specified counseling: Secondary | ICD-10-CM

## 2014-03-16 DIAGNOSIS — Z1211 Encounter for screening for malignant neoplasm of colon: Secondary | ICD-10-CM

## 2014-03-16 MED ORDER — SIMVASTATIN 20 MG PO TABS: 20.0000 mg | ORAL_TABLET | Freq: Every day | ORAL | Status: DC

## 2014-03-16 MED ORDER — LISINOPRIL 10 MG PO TABS: 10.0000 mg | ORAL_TABLET | Freq: Every day | ORAL | Status: DC

## 2014-03-16 MED ORDER — METOPROLOL TARTRATE 25 MG PO TABS: 12.5000 mg | ORAL_TABLET | Freq: Every day | ORAL | Status: DC

## 2014-03-16 NOTE — Patient Instructions (Addendum)
Go to the lab on the way out.  We'll contact you with your lab report. Recheck A1c in about 3 months before a visit.  Work on you diet in the meantime and try to exercise a little more.  Take care.  Glad to see you.

## 2014-03-16 NOTE — Progress Notes (Signed)
Pre visit review using our clinic review tool, if applicable. No additional management support is needed unless otherwise documented below in the visit note.  CPE- See plan.  Routine anticipatory guidance given to patient.  See health maintenance. Tetanus 2006 Flu prev done. Next one to be done at work this fall.  Shingles shot done at pharmacy 2015 PNA 2014 D/w patient [id number] for colon cancer screening, including IFOB vs. colonoscopy.  Risks and benefits of both were discussed and patient voiced understanding.  Pt elects for: IFOB.  Prostate cancer screening and PSA options (with potential risks and benefits of testing vs not testing) were discussed along with recent recs/guidelines.  He declined testing PSA at this point. Living will d/w pt.  Wife designated if patient were incapacitated.   Diet and exercise d/w pt.    Diabetes:  No meds Hypoglycemic episodes:no sx Hyperglycemic episodes:no Feet problems:no Blood Sugars averaging: not checked eye exam within last year:  Due, he'll check on that. Diet has been good, limiting carbs.  Exercise has been limited by work. Weight up, discussed.  He's trying to get back to the gym.    CAD/HLD Using medication without problems or lightheadedness: yes Chest pain with exertion: no Edema: no Short of breath: no  PMH and SH reviewed  Meds, vitals, and allergies reviewed.   ROS: See HPI.  Otherwise negative.    GEN: nad, alert and oriented HEENT: mucous membranes moist NECK: supple w/o LA CV: rrr. PULM: ctab, no inc wob ABD: soft, +bs EXT: no edema SKIN: no acute rash  Diabetic foot exam: Normal inspection No skin breakdown No calluses  Normal DP pulses Normal sensation to light touch and monofilament Nails normal

## 2014-03-18 DIAGNOSIS — Z7189 Other specified counseling: Secondary | ICD-10-CM | POA: Insufficient documentation

## 2014-03-18 NOTE — Assessment & Plan Note (Addendum)
Weight is the issue here, will work on diet and exercise, recheck labs later in 2015.  He agrees.  Labs dw pt. He'll call about eye exam.

## 2014-03-18 NOTE — Assessment & Plan Note (Signed)
Routine anticipatory guidance given to patient.  See health maintenance. Tetanus 2006 Flu prev done. Next one to be done at work this fall.  Shingles shot done at pharmacy 2015 PNA 2014 D/w patient [id number] for colon cancer screening, including IFOB vs. colonoscopy.  Risks and benefits of both were discussed and patient voiced understanding.  Pt elects for: IFOB.  Prostate cancer screening and PSA options (with potential risks and benefits of testing vs not testing) were discussed along with recent recs/guidelines.  He declined testing PSA at this point. Living will d/w pt.  Wife designated if patient were incapacitated.   Diet and exercise d/w pt.

## 2014-03-18 NOTE — Assessment & Plan Note (Signed)
Weight is the issue here, will work on diet and exercise, recheck labs later in 2015.  He agrees.  Labs dw pt.

## 2014-03-19 ENCOUNTER — Other Ambulatory Visit: Payer: Self-pay | Admitting: Family Medicine

## 2014-03-26 ENCOUNTER — Other Ambulatory Visit: Payer: BC Managed Care – PPO

## 2014-03-26 ENCOUNTER — Other Ambulatory Visit (INDEPENDENT_AMBULATORY_CARE_PROVIDER_SITE_OTHER): Payer: BC Managed Care – PPO

## 2014-03-26 DIAGNOSIS — Z1211 Encounter for screening for malignant neoplasm of colon: Secondary | ICD-10-CM

## 2014-03-26 LAB — FECAL OCCULT BLOOD, IMMUNOCHEMICAL: Fecal Occult Bld: NEGATIVE

## 2014-03-27 ENCOUNTER — Encounter: Payer: Self-pay | Admitting: *Deleted

## 2014-06-18 ENCOUNTER — Other Ambulatory Visit (INDEPENDENT_AMBULATORY_CARE_PROVIDER_SITE_OTHER): Payer: BC Managed Care – PPO

## 2014-06-18 DIAGNOSIS — E119 Type 2 diabetes mellitus without complications: Secondary | ICD-10-CM

## 2014-06-18 LAB — HEMOGLOBIN A1C: Hgb A1c MFr Bld: 8.3 % — ABNORMAL HIGH (ref 4.6–6.5)

## 2014-06-22 ENCOUNTER — Encounter: Payer: Self-pay | Admitting: Family Medicine

## 2014-06-22 ENCOUNTER — Ambulatory Visit (INDEPENDENT_AMBULATORY_CARE_PROVIDER_SITE_OTHER): Payer: BC Managed Care – PPO | Admitting: Family Medicine

## 2014-06-22 VITALS — BP 120/70 | HR 81 | Temp 98.0°F | Wt 250.5 lb

## 2014-06-22 DIAGNOSIS — E119 Type 2 diabetes mellitus without complications: Secondary | ICD-10-CM

## 2014-06-22 DIAGNOSIS — IMO0002 Reserved for concepts with insufficient information to code with codable children: Secondary | ICD-10-CM

## 2014-06-22 DIAGNOSIS — E1165 Type 2 diabetes mellitus with hyperglycemia: Secondary | ICD-10-CM

## 2014-06-22 NOTE — Patient Instructions (Signed)
Recheck lipids and A1c in about 3 months.   Work on your [employer] in the meantime.  Take care.  Glad to see you.

## 2014-06-22 NOTE — Progress Notes (Signed)
Pre visit review using our clinic review tool, if applicable. No additional management support is needed unless otherwise documented below in the visit note.  Diabetes:  No meds.  Hypoglycemic episodes: no Hyperglycemic episodes: no Feet problems: no Blood Sugars averaging: not checked eye exam within last year: due, dw pt.   "I'm tired of beating myself up about my diet, I'm cutting back on carbs."  Weight is down 2 lbs since last OV.  A1c dw pt.   Work is going well, but he's still been busy.  He's still working 10+ hours most days of the week.  D/w pt.   Meds, vitals, and allergies reviewed.   ROS: See HPI.  Otherwise negative.    GEN: nad, alert and oriented HEENT: mucous membranes moist NECK: supple w/o LA CV: rrr. PULM: ctab, no inc wob ABD: soft, +bs EXT: no edema SKIN: no acute rash

## 2014-06-22 NOTE — Assessment & Plan Note (Signed)
Offered/encouraged metformin.  He wants another 3 months of work on [place].  D/w pt.  Recheck in 3 months.  Weight continues to be the issue.  A1c d/w pt, along with his overall trend.

## 2014-09-17 ENCOUNTER — Other Ambulatory Visit (INDEPENDENT_AMBULATORY_CARE_PROVIDER_SITE_OTHER): Payer: BLUE CROSS/BLUE SHIELD

## 2014-09-17 ENCOUNTER — Other Ambulatory Visit: Payer: BC Managed Care – PPO

## 2014-09-17 DIAGNOSIS — E780 Pure hypercholesterolemia: Secondary | ICD-10-CM

## 2014-09-17 DIAGNOSIS — E119 Type 2 diabetes mellitus without complications: Secondary | ICD-10-CM

## 2014-09-17 LAB — LIPID PANEL
Cholesterol: 174 mg/dL (ref 0–200)
HDL: 43.6 mg/dL (ref >39.00)
NONHDL: 130.4
Total CHOL/HDL Ratio: 4
Triglycerides: 212 mg/dL — ABNORMAL HIGH (ref 0.0–149.0)
VLDL: 42.4 mg/dL — AB (ref 0.0–40.0)

## 2014-09-17 LAB — LDL CHOLESTEROL, DIRECT: Direct LDL: 92 mg/dL

## 2014-09-17 LAB — HEMOGLOBIN A1C: Hgb A1c MFr Bld: 10.2 % — ABNORMAL HIGH (ref 4.6–6.5)

## 2014-09-24 ENCOUNTER — Ambulatory Visit (INDEPENDENT_AMBULATORY_CARE_PROVIDER_SITE_OTHER): Payer: BLUE CROSS/BLUE SHIELD | Admitting: Family Medicine

## 2014-09-24 ENCOUNTER — Encounter: Payer: Self-pay | Admitting: Family Medicine

## 2014-09-24 VITALS — BP 104/72 | HR 76 | Temp 97.9°F | Wt 248.8 lb

## 2014-09-24 DIAGNOSIS — E1165 Type 2 diabetes mellitus with hyperglycemia: Secondary | ICD-10-CM

## 2014-09-24 DIAGNOSIS — IMO0002 Reserved for concepts with insufficient information to code with codable children: Secondary | ICD-10-CM

## 2014-09-24 MED ORDER — METFORMIN HCL 500 MG PO TABS: 500.0000 mg | ORAL_TABLET | Freq: Two times a day (BID) | ORAL | Status: DC

## 2014-09-24 NOTE — Progress Notes (Signed)
Pre visit review using our clinic review tool, if applicable. No additional management support is needed unless otherwise documented below in the visit note.  Diabetes:  Using medications without difficulties: no meds Hypoglycemic episodes: not checked, no sx Hyperglycemic episodes: not checked, no sx Feet problems: no Blood Sugars averaging: not checked eye exam within last year: due, d/w pt.   A1c up, d/w pt. D/w pt about diet and exercise.    Meds, vitals, and allergies reviewed.   ROS: See HPI.  Otherwise negative.    GEN: nad, alert and oriented HEENT: mucous membranes moist NECK: supple w/o LA CV: rrr. PULM: ctab, no inc wob ABD: soft, +bs EXT: no edema

## 2014-09-24 NOTE — Patient Instructions (Addendum)
Take metformin once a day for about 1-2 weeks then increase to 2 a day.  If GI upset, then cut back on the dose.  Recheck in 3 months, labs ahead of the visit.  Work on [employer].   Take care.  Glad to see you.

## 2014-09-25 NOTE — Assessment & Plan Note (Signed)
A1c up, d/w pt. D/w pt about diet and exercise.   Start metformin, he'll be strict with diet and exercise.  Routine med cautions given to patient.  Recheck in 3 months.  He agrees.

## 2014-12-16 ENCOUNTER — Other Ambulatory Visit: Payer: Self-pay | Admitting: Family Medicine

## 2014-12-16 DIAGNOSIS — E119 Type 2 diabetes mellitus without complications: Secondary | ICD-10-CM

## 2014-12-19 ENCOUNTER — Other Ambulatory Visit (INDEPENDENT_AMBULATORY_CARE_PROVIDER_SITE_OTHER): Payer: BLUE CROSS/BLUE SHIELD

## 2014-12-19 DIAGNOSIS — E119 Type 2 diabetes mellitus without complications: Secondary | ICD-10-CM | POA: Diagnosis not present

## 2014-12-19 LAB — LIPID PANEL
CHOL/HDL RATIO: 3
CHOLESTEROL: 159 mg/dL (ref 0–200)
HDL: 50.9 mg/dL (ref >39.00)
LDL CALC: 80 mg/dL (ref 0–99)
NonHDL: 108.1
TRIGLYCERIDES: 142 mg/dL (ref 0.0–149.0)
VLDL: 28.4 mg/dL (ref 0.0–40.0)

## 2014-12-19 LAB — HEMOGLOBIN A1C: Hgb A1c MFr Bld: 9.2 % — ABNORMAL HIGH (ref 4.6–6.5)

## 2014-12-24 ENCOUNTER — Ambulatory Visit (INDEPENDENT_AMBULATORY_CARE_PROVIDER_SITE_OTHER): Payer: BLUE CROSS/BLUE SHIELD | Admitting: Family Medicine

## 2014-12-24 ENCOUNTER — Encounter: Payer: Self-pay | Admitting: Family Medicine

## 2014-12-24 VITALS — BP 118/68 | HR 68 | Temp 98.4°F | Wt 249.0 lb

## 2014-12-24 DIAGNOSIS — E1165 Type 2 diabetes mellitus with hyperglycemia: Secondary | ICD-10-CM

## 2014-12-24 DIAGNOSIS — IMO0002 Reserved for concepts with insufficient information to code with codable children: Secondary | ICD-10-CM

## 2014-12-24 MED ORDER — METFORMIN HCL 500 MG PO TABS: 500.0000 mg | ORAL_TABLET | Freq: Two times a day (BID) | ORAL | Status: DC

## 2014-12-24 NOTE — Progress Notes (Signed)
Pre visit review using our clinic review tool, if applicable. No additional management support is needed unless otherwise documented below in the visit note.  Diabetes:  Using medications without difficulties:yes, occ diarrhea but manageable.   Hypoglycemic episodes:no sx Hyperglycemic episodes:no sx Feet problems:no Blood Sugars averaging: not checked eye exam within last year: due, d/w pt.   A1c better, not at goal, but better, d/w pt.  Lipids improved.    Meds, vitals, and allergies reviewed.   ROS: See HPI.  Otherwise negative.    GEN: nad, alert and oriented HEENT: mucous membranes moist NECK: supple w/o LA CV: rrr. PULM: ctab, no inc wob EXT: no edema  Diabetic foot exam: Normal inspection No skin breakdown No calluses  Normal DP pulses Normal sensation to light touch and monofilament Nails normal

## 2014-12-24 NOTE — Patient Instructions (Addendum)
Call about an eye exam.   Recheck in about 3 months at a physical, labs ahead of time.  Up the metformin, max 4 pills a day.  Work on your [employer].  Take care.  Glad to see you.

## 2014-12-24 NOTE — Assessment & Plan Note (Signed)
A1c and lipids improved.  A1c still not at goal.  Inc metformin up to 4 tabs a day if tolerated.  Work more on diet and exercise, d/w pt.  He agrees.  Recheck in about 3 months at CPE.  Labs given to patient.

## 2015-03-13 ENCOUNTER — Other Ambulatory Visit: Payer: Self-pay | Admitting: Family Medicine

## 2015-03-13 DIAGNOSIS — E1165 Type 2 diabetes mellitus with hyperglycemia: Secondary | ICD-10-CM

## 2015-03-13 DIAGNOSIS — IMO0002 Reserved for concepts with insufficient information to code with codable children: Secondary | ICD-10-CM

## 2015-03-19 ENCOUNTER — Encounter: Payer: Self-pay | Admitting: Family Medicine

## 2015-03-19 ENCOUNTER — Other Ambulatory Visit (INDEPENDENT_AMBULATORY_CARE_PROVIDER_SITE_OTHER): Payer: BLUE CROSS/BLUE SHIELD

## 2015-03-19 DIAGNOSIS — IMO0002 Reserved for concepts with insufficient information to code with codable children: Secondary | ICD-10-CM

## 2015-03-19 DIAGNOSIS — E1165 Type 2 diabetes mellitus with hyperglycemia: Secondary | ICD-10-CM | POA: Diagnosis not present

## 2015-03-19 LAB — COMPREHENSIVE METABOLIC PANEL
ALT: 21 U/L (ref 0–53)
AST: 14 U/L (ref 0–37)
Albumin: 4.3 g/dL (ref 3.5–5.2)
Alkaline Phosphatase: 52 U/L (ref 39–117)
BUN: 15 mg/dL (ref 6–23)
CHLORIDE: 103 meq/L (ref 96–112)
CO2: 29 meq/L (ref 19–32)
Calcium: 9.6 mg/dL (ref 8.4–10.5)
Creatinine, Ser: 0.88 mg/dL (ref 0.40–1.50)
GFR: 93.28 mL/min (ref >60.00)
GLUCOSE: 176 mg/dL — AB (ref 70–99)
Potassium: 4.5 mEq/L (ref 3.5–5.1)
Sodium: 137 mEq/L (ref 135–145)
Total Bilirubin: 0.5 mg/dL (ref 0.2–1.2)
Total Protein: 7.2 g/dL (ref 6.0–8.3)

## 2015-03-19 LAB — LIPID PANEL
CHOLESTEROL: 154 mg/dL (ref 0–200)
HDL: 48.5 mg/dL (ref >39.00)
LDL Cholesterol: 74 mg/dL (ref 0–99)
NONHDL: 105.78
Total CHOL/HDL Ratio: 3
Triglycerides: 157 mg/dL — ABNORMAL HIGH (ref 0.0–149.0)
VLDL: 31.4 mg/dL (ref 0.0–40.0)

## 2015-03-19 LAB — HEMOGLOBIN A1C: HEMOGLOBIN A1C: 7.4 % — AB (ref 4.6–6.5)

## 2015-03-26 ENCOUNTER — Encounter: Payer: Self-pay | Admitting: Family Medicine

## 2015-03-26 ENCOUNTER — Ambulatory Visit (INDEPENDENT_AMBULATORY_CARE_PROVIDER_SITE_OTHER): Payer: BLUE CROSS/BLUE SHIELD | Admitting: Family Medicine

## 2015-03-26 VITALS — BP 104/62 | HR 67 | Temp 97.9°F | Ht 74.0 in | Wt 238.8 lb

## 2015-03-26 DIAGNOSIS — G4730 Sleep apnea, unspecified: Secondary | ICD-10-CM | POA: Diagnosis not present

## 2015-03-26 DIAGNOSIS — E119 Type 2 diabetes mellitus without complications: Secondary | ICD-10-CM | POA: Diagnosis not present

## 2015-03-26 DIAGNOSIS — Z0000 Encounter for general adult medical examination without abnormal findings: Secondary | ICD-10-CM | POA: Diagnosis not present

## 2015-03-26 DIAGNOSIS — IMO0002 Reserved for concepts with insufficient information to code with codable children: Secondary | ICD-10-CM

## 2015-03-26 DIAGNOSIS — Z1211 Encounter for screening for malignant neoplasm of colon: Secondary | ICD-10-CM

## 2015-03-26 DIAGNOSIS — Z23 Encounter for immunization: Secondary | ICD-10-CM | POA: Diagnosis not present

## 2015-03-26 DIAGNOSIS — I2510 Atherosclerotic heart disease of native coronary artery without angina pectoris: Secondary | ICD-10-CM | POA: Diagnosis not present

## 2015-03-26 DIAGNOSIS — Z119 Encounter for screening for infectious and parasitic diseases, unspecified: Secondary | ICD-10-CM

## 2015-03-26 DIAGNOSIS — E1165 Type 2 diabetes mellitus with hyperglycemia: Secondary | ICD-10-CM

## 2015-03-26 MED ORDER — SIMVASTATIN 20 MG PO TABS: 20.0000 mg | ORAL_TABLET | Freq: Every day | ORAL | Status: DC

## 2015-03-26 MED ORDER — LISINOPRIL 10 MG PO TABS: 10.0000 mg | ORAL_TABLET | Freq: Every day | ORAL | Status: DC

## 2015-03-26 MED ORDER — METOPROLOL TARTRATE 25 MG PO TABS: 12.5000 mg | ORAL_TABLET | Freq: Every day | ORAL | Status: DC

## 2015-03-26 MED ORDER — METFORMIN HCL 500 MG PO TABS: 500.0000 mg | ORAL_TABLET | Freq: Two times a day (BID) | ORAL | Status: DC

## 2015-03-26 NOTE — Progress Notes (Signed)
Pre visit review using our clinic review tool, if applicable. No additional management support is needed unless otherwise documented below in the visit note.  CPE- See plan.  Routine anticipatory guidance given to patient.  See health maintenance. Tetanus 2016 Flu shot done at work Shingles shot done  PNA vaccine up to date.  D/w patient [id number] for colon cancer screening, including IFOB vs. colonoscopy. Risks and benefits of both were discussed and patient voiced understanding. Pt elects for: IFOB.  Diet and exercise d/w pt.  Weight loss with nutrisystem, intentionally.   Prostate cancer screening and PSA options (with potential risks and benefits of testing vs not testing) were discussed along with recent recs/guidelines. He declined testing PSA at this point.  Living will d/w pt. Would have wife designated if incapacitated.  HIV and HCV screening d/w pt. He opts in for next set of labs.    CAD.  D/w pt about seeing cards.  I encouraged him to go to re-est care.  He'll consider.  No CP, SOB, BLE edema.  No myalgias on statin.   On CPAP for OSA.  Compliant.  Good relief from use.  No ADE with use.  Hasn't seen pulmonary recently.  D/w pt.    Diabetes:  Using medications without difficulties:yes Hypoglycemic episodes:no sx Hyperglycemic episodes:no sx Feet problems: no Blood Sugars averaging: not checked eye exam within last year:due, d/w pt.   A1c d/w pt.  Improved.    PMH and SH reviewed  Meds, vitals, and allergies reviewed.   ROS: See HPI.  Otherwise negative.    GEN: nad, alert and oriented HEENT: mucous membranes moist NECK: supple w/o LA CV: rrr. PULM: ctab, no inc wob ABD: soft, +bs EXT: no edema SKIN: no acute rash  Diabetic foot exam: Normal inspection No skin breakdown No calluses  Normal DP pulses Normal sensation to light touch and monofilament Nails normal

## 2015-03-26 NOTE — Patient Instructions (Addendum)
Go to the lab on the way out.  We'll contact you with your lab report. Recheck labs before a visit in about 4 months.   Take care.  Glad to see you.

## 2015-03-27 ENCOUNTER — Telehealth: Payer: Self-pay | Admitting: Family Medicine

## 2015-03-27 DIAGNOSIS — I2510 Atherosclerotic heart disease of native coronary artery without angina pectoris: Secondary | ICD-10-CM | POA: Insufficient documentation

## 2015-03-27 DIAGNOSIS — G4733 Obstructive sleep apnea (adult) (pediatric): Secondary | ICD-10-CM

## 2015-03-27 DIAGNOSIS — I25810 Atherosclerosis of coronary artery bypass graft(s) without angina pectoris: Secondary | ICD-10-CM

## 2015-03-27 NOTE — Assessment & Plan Note (Signed)
We can get him set up with pulmonary in [place].  See following phone note.

## 2015-03-27 NOTE — Telephone Encounter (Signed)
Left message on answering machine at home number to call back. 

## 2015-03-27 NOTE — Assessment & Plan Note (Signed)
No sx, we can get him set up with cardiology in [place].  See following phone note.

## 2015-03-27 NOTE — Assessment & Plan Note (Signed)
Routine anticipatory guidance given to patient.  See health maintenance. Tetanus 2016 Flu shot done at work Shingles shot done  PNA vaccine up to date.  D/w patient [id number] for colon cancer screening, including IFOB vs. colonoscopy. Risks and benefits of both were discussed and patient voiced understanding. Pt elects for: IFOB.  Diet and exercise d/w pt.  Weight loss with nutrisystem, intentionally.   Prostate cancer screening and PSA options (with potential risks and benefits of testing vs not testing) were discussed along with recent recs/guidelines. He declined testing PSA at this point.  Living will d/w pt. Would have wife designated if incapacitated.  HIV and HCV screening d/w pt. He opts in for next set of labs.

## 2015-03-27 NOTE — Telephone Encounter (Addendum)
Call pt.   We can get him set up with pulmonary and cardiology in [place].   I put in the order for pulmonary re: OSA.  I'm awaiting a message back from cardiology about the referral but I should be able to put that in soon.   Please route this back to me after you call him.   Thanks.

## 2015-03-27 NOTE — Assessment & Plan Note (Signed)
Not controlled but improved.  Needs to continue work on weight and diet/exercise.  Labs d/w pt.  He agrees.  Reasonable lipid control with statin.

## 2015-03-28 NOTE — Telephone Encounter (Signed)
I got a note back from Dr. Rockey Situ, I put in the cards referral. Thanks.

## 2015-03-28 NOTE — Telephone Encounter (Signed)
Pt returned your call. Please return call to [phone] work number

## 2015-03-28 NOTE — Telephone Encounter (Signed)
Spoke with patient and advised that referral has been put in.

## 2015-03-28 NOTE — Telephone Encounter (Signed)
Thanks

## 2015-03-29 ENCOUNTER — Telehealth: Payer: Self-pay

## 2015-03-29 ENCOUNTER — Other Ambulatory Visit: Payer: Self-pay | Admitting: Family Medicine

## 2015-03-29 NOTE — Telephone Encounter (Signed)
l mom to schedule next avail appt with Dr. Rockey Situ

## 2015-03-29 NOTE — Telephone Encounter (Signed)
-----   Message from Jeannette How sent at 03/28/2015  2:36 PM EDT ----- Gabriel Cirri,  Can you set up with Dr. Donivan Scull next available for this patient. Thanks    ----- Message -----    From: Minna Merritts, MD    Sent: 03/27/2015   5:59 PM      To: Jeannette How, Tonia Ghent, MD  Sure! Happy to help  Izora Gala, can you help me set this appt up at patients convenience? thx  Tim   ----- Message -----    From: Tonia Ghent, MD    Sent: 03/27/2015   1:54 PM      To: Minna Merritts, MD  Patient has known CAD.  No symptoms, feeling well.  Hasn't seen cards in years.   I'd like to get him set up at your clinic.  I had to encourage him to even consider f/u with cards in general.   Can I send him your way?   Thanks.   Brigitte Pulse

## 2015-04-15 ENCOUNTER — Other Ambulatory Visit (INDEPENDENT_AMBULATORY_CARE_PROVIDER_SITE_OTHER): Payer: BLUE CROSS/BLUE SHIELD

## 2015-04-15 DIAGNOSIS — Z1211 Encounter for screening for malignant neoplasm of colon: Secondary | ICD-10-CM

## 2015-04-15 LAB — FECAL OCCULT BLOOD, IMMUNOCHEMICAL: Fecal Occult Bld: NEGATIVE

## 2015-04-17 ENCOUNTER — Encounter: Payer: Self-pay | Admitting: Internal Medicine

## 2015-04-17 ENCOUNTER — Ambulatory Visit (INDEPENDENT_AMBULATORY_CARE_PROVIDER_SITE_OTHER): Payer: BLUE CROSS/BLUE SHIELD | Admitting: Internal Medicine

## 2015-04-17 VITALS — BP 136/70 | HR 76 | Ht 74.0 in | Wt 240.0 lb

## 2015-04-17 DIAGNOSIS — G4730 Sleep apnea, unspecified: Secondary | ICD-10-CM

## 2015-04-17 DIAGNOSIS — Z7189 Other specified counseling: Secondary | ICD-10-CM

## 2015-04-17 DIAGNOSIS — I2510 Atherosclerotic heart disease of native coronary artery without angina pectoris: Secondary | ICD-10-CM | POA: Diagnosis not present

## 2015-04-17 DIAGNOSIS — IMO0002 Reserved for concepts with insufficient information to code with codable children: Secondary | ICD-10-CM

## 2015-04-17 DIAGNOSIS — E1165 Type 2 diabetes mellitus with hyperglycemia: Secondary | ICD-10-CM

## 2015-04-17 NOTE — Progress Notes (Signed)
[facility] Consultation      Assessment and Plan:  -Obstructive Sleep Apnea. Currently doing well on CPAP at a level of 11, he is currently using CPAP nightly for at least 6 hours a night and benefit from its use in that he is no longer sleepy. Recommend continue CPAP use.  -Diabetes  -Essential Hypertension  -Coronary Artery Disease     Date: 04/17/2015  MRN# [id number] Garrett Woodard 25-Apr-1951  Referring Physician:   HOLSTON Woodard is a 64 y.o. old male seen in consultation for chief complaint of:    Chief Complaint  Patient presents with  . Advice Only    has CPAP machine; sleeps 6-8hrs nightly; feels rested daily    HPI:  The patient is a 64 year old male with a history 51 significant for Coronary artery disease, and diabetes mellitus. He has a history of a severe obstructive sleep apnea diagnosed in 2010, and was started on CPAP at 13. He has been doing well with the CPAP he uses it every night. Goes to bed at 8 pm, wakes at 3 am. When wakes in am feels that he got a good night sleep.  His wife does not say that he snores, and he is feeling well rested.  He is using a face mask, and doing well with it. He changes his mask about once every 2 hours, he cleans it about once every 2 to 3 weeks.   Reviewed sleep test results from 01/03/2009;  PSG >> severe obstructive sleep apnea causing sleep fragmentation & deaturation, lowest 85%, AHI 69/h >> start CPAP 11 cm , large full face mask, heated humidity, download in 4 wks  PMHX:   Past Medical History  Diagnosis Date  . Diabetes mellitus     typeII  . Hypertension   . Coronary artery disease 10/04/2003    Cabg atrial septal defect repair//[facility]) EF 70%, 3 vessell dz. (Dr.Callwood)10/04/2003  . Hyperlipidemia    Surgical Hx:  Past Surgical History  Procedure Laterality Date  . Coronary angioplasty  10/04/2003    cath.  at [facility]): EF70%, 3 vessell dz. Dr. Gwenlyn Saran wnl 11/16/2003//  Exercise Cardiolite wnl EF 60% 12/03/2003  . 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 Stroke Mother   . Hypertension Mother   . Depression Mother   . Heart disease Father 30    CABG x4  . Cancer Father     lymphoma at age 69  . Alcohol abuse Sister     knee replacement bilaterial disabled  . Prostate cancer Neg Hx   . Colon cancer Neg Hx    Social Hx:   Social History  Substance Use Topics  . Smoking status: Former Smoker    Types: Cigarettes    Quit date: 10/04/2006  . Smokeless tobacco: Never Used  . Alcohol Use: 0.0 oz/week    0 Standard drinks or equivalent per week     Comment: 5 to 6 drinks in a week   Medication:   Current Outpatient Rx  Name  Route  Sig  Dispense  Refill  . aspirin 81 MG EC tablet   Oral   Take 81 mg by mouth daily.           Marland Kitchen lisinopril (PRINIVIL,ZESTRIL) 10 MG tablet   Oral   Take 1 tablet (10 mg total) by mouth daily.  90 tablet   3   . metFORMIN (GLUCOPHAGE) 500 MG tablet   Oral   Take 1 tablet (500 mg total) by mouth 2 (two) times daily with a meal.   180 tablet   3   . metoprolol tartrate (LOPRESSOR) 25 MG tablet   Oral   Take 0.5 tablets (12.5 mg total) by mouth daily.   45 tablet   3   . simvastatin (ZOCOR) 20 MG tablet   Oral   Take 1 tablet (20 mg total) by mouth at bedtime.   90 tablet   3       Allergies:  Review of patient's allergies indicates no known allergies.  Review of Systems: Gen:  Denies  fever, sweats, chills HEENT: Denies blurred vision, double vision. bleeds, sore throat Cvc:  No dizziness, chest pain. Resp:   Denies cough or sputum porduction, shortness of breath Gi: Denies swallowing difficulty, stomach pain. Gu:  Denies bladder incontinence, burning urine Ext:   No Joint pain, stiffness. Skin: No skin rash,  hives Endoc:  No polyuria, polydipsia. Psych: No depression, insomnia. Other:  All  other systems were reviewed with the patient and were negative other that what is mentioned in the HPI.   Physical Examination:   VS: BP 136/70 mmHg  Pulse 76  Ht 6\' 2"  (1.88 m)  Wt 240 lb (108.863 kg)  BMI 30.80 kg/m2  SpO2 95%  General Appearance: No distress  Neuro:without focal findings,  speech normal,  HEENT: PERRLA, EOM intact.  Mallampati 3 Pulmonary: normal breath sounds, No wheezing.  CardiovascularNormal S1,S2.  No m/r/g.   Abdomen: Benign, Soft, non-tender. Renal:  No costovertebral tenderness  GU:  No performed at this time. Endoc: No evident thyromegaly, no signs of acromegaly. Skin:   warm, no rashes, no ecchymosis  Extremities: normal, no cyanosis, clubbing.  Other findings:    LABORATORY PANEL:   CBC No results for input(s): WBC, HGB, HCT, PLT in the last 168 hours. ------------------------------------------------------------------------------------------------------------------  Chemistries  No results for input(s): NA, K, CL, CO2, GLUCOSE, BUN, CREATININE, CALCIUM, MG, AST, ALT, ALKPHOS, BILITOT in the last 168 hours.  Invalid input(s): GFRCGP ------------------------------------------------------------------------------------------------------------------  Cardiac Enzymes No results for input(s): TROPONINI in the last 168 hours. ------------------------------------------------------------    Thank  you for the consultation and for allowing [facility] to assist in the care of your patient. Our recommendations are noted above.  Please contact us if we can be of further service.   Marda Stalker, MD.  Board Certified in Internal Medicine, Pulmonary Medicine, [place], and Sleep Medicine.   P[facility] and Critical Care Office Number: [phone]  Patricia Pesa, M.D.  Vilinda Boehringer, M.D.  Cheral Marker, M.D

## 2015-04-17 NOTE — Patient Instructions (Signed)
--  Look out for symptoms of snoring or increased daytime sleepiness, you may need a higher cpap pressure if this happens.  --follow up in 1 year.  --Contact us sooner if your machine breaks or you notice your symptoms worsen.  --Rinse mask once per week.

## 2015-05-20 ENCOUNTER — Encounter: Payer: Self-pay | Admitting: Cardiovascular Disease

## 2015-05-20 ENCOUNTER — Ambulatory Visit (INDEPENDENT_AMBULATORY_CARE_PROVIDER_SITE_OTHER): Payer: BLUE CROSS/BLUE SHIELD | Admitting: Cardiovascular Disease

## 2015-05-20 VITALS — BP 134/78 | HR 81 | Ht 74.0 in | Wt 245.8 lb

## 2015-05-20 DIAGNOSIS — E785 Hyperlipidemia, unspecified: Secondary | ICD-10-CM | POA: Diagnosis not present

## 2015-05-20 DIAGNOSIS — Z951 Presence of aortocoronary bypass graft: Secondary | ICD-10-CM | POA: Diagnosis not present

## 2015-05-20 DIAGNOSIS — I2510 Atherosclerotic heart disease of native coronary artery without angina pectoris: Secondary | ICD-10-CM

## 2015-05-20 DIAGNOSIS — E1159 Type 2 diabetes mellitus with other circulatory complications: Secondary | ICD-10-CM

## 2015-05-20 DIAGNOSIS — E7800 Pure hypercholesterolemia, unspecified: Secondary | ICD-10-CM

## 2015-05-20 DIAGNOSIS — I10 Essential (primary) hypertension: Secondary | ICD-10-CM | POA: Diagnosis not present

## 2015-05-20 DIAGNOSIS — IMO0002 Reserved for concepts with insufficient information to code with codable children: Secondary | ICD-10-CM

## 2015-05-20 DIAGNOSIS — E669 Obesity, unspecified: Secondary | ICD-10-CM

## 2015-05-20 DIAGNOSIS — E1165 Type 2 diabetes mellitus with hyperglycemia: Secondary | ICD-10-CM

## 2015-05-20 MED ORDER — ROSUVASTATIN CALCIUM 20 MG PO TABS: 20.0000 mg | ORAL_TABLET | Freq: Every day | ORAL | Status: DC

## 2015-05-20 NOTE — Assessment & Plan Note (Signed)
We have encouraged continued exercise, careful diet management in an effort to lose weight. Additional medications may be needed

## 2015-05-20 NOTE — Assessment & Plan Note (Signed)
High risk of coronary disease given diabetes, hyperlipidemia. Risk factors discussed with him in detail He will cause for any symptoms concerning for angina

## 2015-05-20 NOTE — Patient Instructions (Addendum)
You are doing well.  Please hold the simvastatin (or do double) Start crestor one a day  Check lipids and LFTs in end of Jan 2017 or early Feb 2017  Please call us if you have new issues that need to be addressed before your next appt.  Your physician wants you to follow-up in: 6 months.  You will receive a reminder letter in the mail two months in advance. If you don't receive a letter, please call our office to schedule the follow-up appointment.  Type 2 Diabetes Mellitus, Adult Type 2 diabetes mellitus, often simply referred to as type 2 diabetes, is a long-lasting (chronic) disease. In type 2 diabetes, the pancreas does not make enough insulin (a hormone), the cells are less responsive to the insulin that is made (insulin resistance), or both. Normally, insulin moves sugars from food into the tissue cells. The tissue cells use the sugars for energy. The lack of insulin or the lack of normal response to insulin causes excess sugars to build up in the blood instead of going into the tissue cells. As a result, high blood sugar (hyperglycemia) develops. The effect of high sugar (glucose) levels can cause many complications. Type 2 diabetes was also previously called adult-onset diabetes, but it can occur at any age.  RISK FACTORS  A person is predisposed to developing type 2 diabetes if someone in the family has the disease and also has one or more of the following primary risk factors:  Weight gain, or being overweight or obese.  An inactive lifestyle.  A history of consistently eating high-calorie foods. Maintaining a normal weight and regular physical activity can reduce the chance of developing type 2 diabetes. SYMPTOMS  A person with type 2 diabetes may not show symptoms initially. The symptoms of type 2 diabetes appear slowly. The symptoms include:  Increased thirst (polydipsia).  Increased urination (polyuria).  Increased urination during the night (nocturia).  Sudden or  unexplained weight changes.  Frequent, recurring infections.  Tiredness (fatigue).  Weakness.  Vision changes, such as blurred vision.  Fruity smell to your breath.  Abdominal pain.  Nausea or vomiting.  Cuts or bruises which are slow to heal.  Tingling or numbness in the hands or feet.  An open skin wound (ulcer). DIAGNOSIS Type 2 diabetes is frequently not diagnosed until complications of diabetes are present. Type 2 diabetes is diagnosed when symptoms or complications are present and when blood glucose levels are increased. Your blood glucose level may be checked by one or more of the following blood tests:  A fasting blood glucose test. You will not be allowed to eat for at least 8 hours before a blood sample is taken.  A random blood glucose test. Your blood glucose is checked at any time of the day regardless of when you ate.  A hemoglobin A1c blood glucose test. A hemoglobin A1c test provides information about blood glucose control over the previous 3 months.  An oral glucose tolerance test (OGTT). Your blood glucose is measured after you have not eaten (fasted) for 2 hours and then after you drink a glucose-containing beverage. TREATMENT   You may need to take insulin or diabetes medicine daily to keep blood glucose levels in the desired range.  If you use insulin, you may need to adjust the dosage depending on the carbohydrates that you eat with each meal or snack.  Lifestyle changes are recommended as part of your treatment. These may include:  Following an individualized diet plan developed  by a nutritionist or dietitian.  Exercising daily. Your health care providers will set individualized treatment goals for you based on your age, your medicines, how long you have had diabetes, and any other medical conditions you have. Generally, the goal of treatment is to maintain the following blood glucose levels:  Before meals (preprandial): 80-130 mg/dL.  After meals  (postprandial): below 180 mg/dL.  A1c: less than 6.5-7%. HOME CARE INSTRUCTIONS   Have your hemoglobin A1c level checked twice a year.  Perform daily blood glucose monitoring as directed by your health care provider.  Monitor urine ketones when you are ill and as directed by your health care provider.  Take your diabetes medicine or insulin as directed by your health care provider to maintain your blood glucose levels in the desired range.  Never run out of diabetes medicine or insulin. It is needed every day.  If you are using insulin, you may need to adjust the amount of insulin given based on your intake of carbohydrates. Carbohydrates can raise blood glucose levels but need to be included in your diet. Carbohydrates provide vitamins, minerals, and fiber which are an essential part of a healthy diet. Carbohydrates are found in fruits, vegetables, whole grains, dairy products, legumes, and foods containing added sugars.  Eat healthy foods. You should make an appointment to see a registered dietitian to help you create an eating plan that is right for you.  Lose weight if you are overweight.  Carry a medical alert card or wear your medical alert jewelry.  Carry a 15-gram carbohydrate snack with you at all times to treat low blood glucose (hypoglycemia). Some examples of 15-gram carbohydrate snacks include:  Glucose tablets, 3 or 4.  Glucose gel, 15-gram tube.  Raisins, 2 tablespoons (24 grams).  Jelly beans, 6.  Animal crackers, 8.  Regular pop, 4 ounces (120 mL).  Gummy treats, 9.  Recognize hypoglycemia. Hypoglycemia occurs with blood glucose levels of 70 mg/dL and below. The risk for hypoglycemia increases when fasting or skipping meals, during or after intense exercise, and during sleep. Hypoglycemia symptoms can include:  Tremors or shakes.  Decreased ability to concentrate.  Sweating.  Increased heart rate.  Headache.  Dry  mouth.  Hunger.  Irritability.  Anxiety.  Restless sleep.  Altered speech or coordination.  Confusion.  Treat hypoglycemia promptly. If you are alert and able to safely swallow, follow the 15:15 rule:  Take 15-20 grams of rapid-acting glucose or carbohydrate. Rapid-acting options include glucose gel, glucose tablets, or 4 ounces (120 mL) of fruit juice, regular soda, or low-fat milk.  Check your blood glucose level 15 minutes after taking the glucose.  Take 15-20 grams more of glucose if the repeat blood glucose level is still 70 mg/dL or below.  Eat a meal or snack within 1 hour once blood glucose levels return to normal.  Be alert to feeling very thirsty and urinating more frequently than usual, which are early signs of hyperglycemia. An early awareness of hyperglycemia allows for prompt treatment. Treat hyperglycemia as directed by your health care provider.  Engage in at least 150 minutes of moderate-intensity physical activity a week, spread over at least 3 days of the week or as directed by your health care provider. In addition, you should engage in resistance exercise at least 2 times a week or as directed by your health care provider. Try to spend no more than 90 minutes at one time inactive.  Adjust your medicine and food intake as needed if  you start a new exercise or sport.  Follow your sick-day plan anytime you are unable to eat or drink as usual.  Do not use any tobacco products including cigarettes, chewing tobacco, or electronic cigarettes. If you need help quitting, ask your health care provider.  Limit alcohol intake to no more than 1 drink per day for nonpregnant women and 2 drinks per day for men. You should drink alcohol only when you are also eating food. Talk with your health care provider whether alcohol is safe for you. Tell your health care provider if you drink alcohol several times a week.  Keep all follow-up visits as directed by your health care  provider. This is important.  Schedule an eye exam soon after the diagnosis of type 2 diabetes and then annually.  Perform daily skin and foot care. Examine your skin and feet daily for cuts, bruises, redness, nail problems, bleeding, blisters, or sores. A foot exam by a health care provider should be done annually.  Brush your teeth and gums at least twice a day and floss at least once a day. Follow up with your dentist regularly.  Share your diabetes management plan with your workplace or school.  Keep your immunizations up to date. It is recommended that you receive a flu (influenza) vaccine every year. It is also recommended that you receive a pneumonia (pneumococcal) vaccine. If you are 88 years of age or older and have never received a pneumonia vaccine, this vaccine may be given as a series of two separate shots. Ask your health care provider which additional vaccines may be recommended.  Learn to manage stress.  Obtain ongoing diabetes education and support as needed.  Participate in or seek rehabilitation as needed to maintain or improve independence and quality of life. Request a physical or occupational therapy referral if you are having foot or hand numbness, or difficulties with grooming, dressing, eating, or physical activity. SEEK MEDICAL CARE IF:   You are unable to eat food or drink fluids for more than 6 hours.  You have nausea and vomiting for more than 6 hours.  Your blood glucose level is over 240 mg/dL.  There is a change in mental status.  You develop an additional serious illness.  You have diarrhea for more than 6 hours.  You have been sick or have had a fever for a couple of days and are not getting better.  You have pain during any physical activity.  SEEK IMMEDIATE MEDICAL CARE IF:  You have difficulty breathing.  You have moderate to large ketone levels.   This information is not intended to replace advice given to you by your health care  provider. Make sure you discuss any questions you have with your health care provider.   Document Released: 07/20/2005 Document Revised: 04/10/2015 Document Reviewed: 02/16/2012 Elsevier Interactive Patient Education 2016 [place] for Diabetes Mellitus Carbohydrate counting is a method for keeping track of the amount of carbohydrates you eat. Eating carbohydrates naturally increases the level of sugar (glucose) in your blood, so it is important for you to know the amount that is okay for you to have in every meal. Carbohydrate counting helps keep the level of glucose in your blood within normal limits. The amount of carbohydrates allowed is different for every person. A dietitian can help you calculate the amount that is right for you. Once you know the amount of carbohydrates you can have, you can count the carbohydrates in the foods you  want to eat. Carbohydrates are found in the following foods:  Grains, such as breads and cereals.  Dried beans and soy products.  Starchy vegetables, such as potatoes, peas, and corn.  Fruit and fruit juices.  Milk and yogurt.  Sweets and snack foods, such as cake, cookies, candy, chips, soft drinks, and fruit drinks. CARBOHYDRATE COUNTING There are two ways to count the carbohydrates in your food. You can use either of the methods or a combination of both. Reading the "Nutrition Facts" on [place] The "Nutrition Facts" is an area that is included on the labels of almost all packaged food and beverages in the [place]. It includes the serving size of that food or beverage and information about the nutrients in each serving of the food, including the grams (g) of carbohydrate per serving.  Decide the number of servings of this food or beverage that you will be able to eat or drink. Multiply that number of servings by the number of grams of carbohydrate that is listed on the label for that serving. The total will be  the amount of carbohydrates you will be having when you eat or drink this food or beverage. Learning Standard Serving Sizes of Food When you eat food that is not packaged or does not include "Nutrition Facts" on the label, you need to measure the servings in order to count the amount of carbohydrates.A serving of most carbohydrate-rich foods contains about 15 g of carbohydrates. The following list includes serving sizes of carbohydrate-rich foods that provide 15 g ofcarbohydrate per serving:   1 slice of bread (1 oz) or 1 six-inch tortilla.    of a hamburger bun or English muffin.  4-6 crackers.   cup unsweetened dry cereal.    cup hot cereal.   cup rice or pasta.    cup mashed potatoes or  of a large baked potato.  1 cup fresh fruit or one small piece of fruit.    cup canned or frozen fruit or fruit juice.  1 cup milk.   cup plain fat-free yogurt or yogurt sweetened with artificial sweeteners.   cup cooked dried beans or starchy vegetable, such as peas, corn, or potatoes.  Decide the number of standard-size servings that you will eat. Multiply that number of servings by 15 (the grams of carbohydrates in that serving). For example, if you eat 2 cups of strawberries, you will have eaten 2 servings and 30 g of carbohydrates (2 servings x 15 g = 30 g). For foods such as soups and casseroles, in which more than one food is mixed in, you will need to count the carbohydrates in each food that is included. EXAMPLE OF CARBOHYDRATE COUNTING Sample Dinner  3 oz chicken breast.   cup of brown rice.   cup of corn.  1 cup milk.   1 cup strawberries with sugar-free whipped topping.  Carbohydrate Calculation Step 1: Identify the foods that contain carbohydrates:   Rice.   Corn.   Milk.   Strawberries. Step 2:Calculate the number of servings eaten of each:   2 servings of rice.   1 serving of corn.   1 serving of milk.   1 serving of  strawberries. Step 3: Multiply each of those number of servings by 15 g:   2 servings of rice x 15 g = 30 g.   1 serving of corn x 15 g = 15 g.   1 serving of milk x 15 g = 15 g.  1 serving of strawberries x 15 g = 15 g. Step 4: Add together all of the amounts to find the total grams of carbohydrates eaten: 30 g + 15 g + 15 g + 15 g = 75 g.   This information is not intended to replace advice given to you by your health care provider. Make sure you discuss any questions you have with your health care provider.   Document Released: 07/20/2005 Document Revised: 08/10/2014 Document Reviewed: 06/16/2013 Elsevier Interactive Patient Education [employer].

## 2015-05-20 NOTE — Assessment & Plan Note (Signed)
He denies any symptoms concerning for angina. Stressed to him the importance of diabetes control, weight loss

## 2015-05-20 NOTE — Assessment & Plan Note (Signed)
He is indicated that he would like a weight loss supplement. Suggested he pursue regular weight loss techniques rather than a supplement such as phentermine. One option for improved diabetes control and weight loss would be victoza. He does not want a shot

## 2015-05-20 NOTE — Assessment & Plan Note (Signed)
Blood pressure is well controlled on today's visit. No changes made to the medications. 

## 2015-05-20 NOTE — Progress Notes (Signed)
Patient ID: Garrett Woodard, male    DOB: August 30, 1950, 64 y.o.   MRN: [id number]  HPI Comments: Mr. Silva is a 64 year old gentleman with prior smoking history, stopped in 2005, coronary artery disease with bypass surgery in 2005 with ASD closure at that time, obstructive sleep apnea, on CPAP, hyperlipidemia, diabetes type 2 with hemoglobin A1c greater than 7, who presents for new patient evaluation.  He denies any shortness of breath or chest discomfort concerning for angina Previous symptoms included some arm discomfort, nausea. He denies any new symptoms. He is interested in taking a weight loss supplement in an effort to improve his diabetes numbers. He has tried numerous diets, they did not seem to work for him. Recent glucose level 170  Lab work reviewed with him showing total cholesterol 154, LDL 74, HDL 48, hemoglobin A1c 7.4 EKG on today's visit shows normal sinus rhythm with rate 81 bpm, rare APC, no significant ST or T-wave changes  No recent stress test available Prior cardiac catheterization in 2005 not available. This was done at [facility] Records will be requested  No Known Allergies  Current Outpatient Prescriptions on File Prior to Visit  Medication Sig Dispense Refill  . aspirin 81 MG EC tablet Take 81 mg by mouth daily.      Marland Kitchen lisinopril (PRINIVIL,ZESTRIL) 10 MG tablet Take 1 tablet (10 mg total) by mouth daily. 90 tablet 3  . metFORMIN (GLUCOPHAGE) 500 MG tablet Take 1 tablet (500 mg total) by mouth 2 (two) times daily with a meal. 180 tablet 3  . metoprolol tartrate (LOPRESSOR) 25 MG tablet Take 0.5 tablets (12.5 mg total) by mouth daily. 45 tablet 3   No current facility-administered medications on file prior to visit.    Past Medical History  Diagnosis Date  . Diabetes mellitus     typeII  . Hypertension   . Coronary artery disease 10/04/2003    Cabg atrial septal defect repair//[facility]) EF 70%, 3 vessell dz. (Dr.Callwood)10/04/2003  . Hyperlipidemia    . Asthma     cats    Past Surgical History  Procedure Laterality Date  . Coronary angioplasty  10/04/2003    cath.  at [facility]): EF70%, 3 vessell dz. Dr. Gwenlyn Saran wnl 11/16/2003// Exercise Cardiolite wnl EF 60% 12/03/2003  . Coronary artery bypass graft  10/2003    CABG atrial septal defect repair    Social History  reports that he quit smoking about 12 years ago. His smoking use included Cigarettes. He has never used smokeless tobacco. He reports that he drinks alcohol. He reports that he does not use illicit drugs.  Family History family history includes Alcohol abuse in his sister; Cancer in his father; Depression in his mother; Heart disease (age of onset: 5) in his father; Hypertension in his mother; Stroke in his mother. There is no history of Prostate cancer or Colon cancer.    Review of Systems  Constitutional: Negative.   HENT: Negative.   Eyes: Negative.   Respiratory: Negative.   Cardiovascular: Negative.   Gastrointestinal: Negative.   Endocrine: Negative.   Musculoskeletal: Negative.   Skin: Negative.   Allergic/Immunologic: Negative.   Neurological: Negative.   Hematological: Negative.   Psychiatric/Behavioral: Negative.   All other systems reviewed and are negative.   BP 134/78 mmHg  Pulse 81  Ht 6\' 2"  (1.88 m)  Wt 245 lb 12 oz (111.471 kg)  BMI 31.54 kg/m2   Physical Exam  Constitutional: He is oriented to person, place, and  time. He appears well-developed and well-nourished.  Obese  HENT:  Head: Normocephalic.  Nose: Nose normal.  Mouth/Throat: Oropharynx is clear and moist.  Eyes: Conjunctivae are normal. Pupils are equal, round, and reactive to light.  Neck: Normal range of motion. Neck supple. No JVD present.  Cardiovascular: Normal rate, regular rhythm, normal heart sounds and intact distal pulses.  Exam reveals no gallop and no friction rub.   No murmur heard. Pulmonary/Chest: Effort normal and breath sounds normal. No respiratory  distress. He has no wheezes. He has no rales. He exhibits no tenderness.  Abdominal: Soft. Bowel sounds are normal. He exhibits no distension. There is no tenderness.  Musculoskeletal: Normal range of motion. He exhibits no edema or tenderness.  Lymphadenopathy:    He has no cervical adenopathy.  Neurological: He is alert and oriented to person, place, and time. Coordination normal.  Skin: Skin is warm and dry. No rash noted. No erythema.  Psychiatric: He has a normal mood and affect. His behavior is normal. Judgment and thought content normal.

## 2015-05-20 NOTE — Assessment & Plan Note (Signed)
Will aim for lower cholesterol given he is high risk of worsening disease. We'll changed him to Crestor 20 mg daily Zetia could be added once this goes generic early next year if lower numbers needed

## 2015-07-16 ENCOUNTER — Other Ambulatory Visit (INDEPENDENT_AMBULATORY_CARE_PROVIDER_SITE_OTHER): Payer: BLUE CROSS/BLUE SHIELD

## 2015-07-16 ENCOUNTER — Other Ambulatory Visit: Payer: Self-pay | Admitting: Family Medicine

## 2015-07-16 DIAGNOSIS — E119 Type 2 diabetes mellitus without complications: Secondary | ICD-10-CM

## 2015-07-16 DIAGNOSIS — Z119 Encounter for screening for infectious and parasitic diseases, unspecified: Secondary | ICD-10-CM

## 2015-07-16 LAB — HEMOGLOBIN A1C: Hgb A1c MFr Bld: 7.9 % — ABNORMAL HIGH (ref 4.6–6.5)

## 2015-07-17 LAB — HIV ANTIBODY (ROUTINE TESTING W REFLEX): HIV: NONREACTIVE

## 2015-07-17 LAB — HEPATITIS C ANTIBODY: HCV Ab: NEGATIVE

## 2015-07-23 ENCOUNTER — Encounter: Payer: Self-pay | Admitting: Family Medicine

## 2015-07-23 ENCOUNTER — Ambulatory Visit (INDEPENDENT_AMBULATORY_CARE_PROVIDER_SITE_OTHER): Payer: BLUE CROSS/BLUE SHIELD | Admitting: Family Medicine

## 2015-07-23 VITALS — BP 106/62 | HR 89 | Temp 98.3°F | Wt 234.2 lb

## 2015-07-23 DIAGNOSIS — E1159 Type 2 diabetes mellitus with other circulatory complications: Secondary | ICD-10-CM | POA: Diagnosis not present

## 2015-07-23 DIAGNOSIS — E119 Type 2 diabetes mellitus without complications: Secondary | ICD-10-CM | POA: Diagnosis not present

## 2015-07-23 DIAGNOSIS — E1165 Type 2 diabetes mellitus with hyperglycemia: Secondary | ICD-10-CM

## 2015-07-23 DIAGNOSIS — IMO0002 Reserved for concepts with insufficient information to code with codable children: Secondary | ICD-10-CM

## 2015-07-23 MED ORDER — METFORMIN HCL 500 MG PO TABS: 500.0000 mg | ORAL_TABLET | Freq: Three times a day (TID) | ORAL | Status: DC

## 2015-07-23 NOTE — Progress Notes (Signed)
Pre visit review using our clinic review tool, if applicable. No additional management support is needed unless otherwise documented below in the visit note.  Weight loss noted on phentermine and B12 per outside MD.  D/w pt about BP monitoring.  No CP.  Not lightheaded.    Diabetes:  Using medications without difficulties: yes Hypoglycemic episodes:no sx Hyperglycemic episodes:no sx Feet problems:no Blood Sugars averaging: not checked.  eye exam within last year: due, d/w pt.   Labs d/w pt.  A1c up.  Started on phentermine about 2 months ago.  D/w pt that I hope his A1c progress is lagging and may improve with weight loss.    Meds, vitals, and allergies reviewed.   ROS: See HPI.  Otherwise negative.    GEN: nad, alert and oriented HEENT: mucous membranes moist NECK: supple w/o LA CV: rrr. PULM: ctab, no inc wob ABD: soft, +bs EXT: no edema  Diabetic foot exam: Normal inspection No skin breakdown No calluses  Normal DP pulses Normal sensation to light touch and monofilament Nails normal

## 2015-07-23 NOTE — Patient Instructions (Addendum)
Call about an eye exam.   Recheck labs in about 3 months, before a visit (labs from [place]) Take care.  Glad to see you.

## 2015-07-24 NOTE — Assessment & Plan Note (Signed)
A1c up.  Started on phentermine about 2 months ago.  D/w pt that I hope his A1c progress is lagging and may improve with weight loss.   I would prefer him to work on weight with diet and exercise.   Recheck in a few months.  He agrees.

## 2015-10-22 ENCOUNTER — Other Ambulatory Visit (INDEPENDENT_AMBULATORY_CARE_PROVIDER_SITE_OTHER): Payer: BLUE CROSS/BLUE SHIELD

## 2015-10-22 DIAGNOSIS — E119 Type 2 diabetes mellitus without complications: Secondary | ICD-10-CM | POA: Diagnosis not present

## 2015-10-22 DIAGNOSIS — I10 Essential (primary) hypertension: Secondary | ICD-10-CM

## 2015-10-22 DIAGNOSIS — I2510 Atherosclerotic heart disease of native coronary artery without angina pectoris: Secondary | ICD-10-CM

## 2015-10-22 DIAGNOSIS — E785 Hyperlipidemia, unspecified: Secondary | ICD-10-CM | POA: Diagnosis not present

## 2015-10-22 LAB — LIPID PANEL
CHOLESTEROL: 103 mg/dL (ref 0–200)
HDL: 48.4 mg/dL (ref >39.00)
LDL CALC: 29 mg/dL (ref 0–99)
NonHDL: 54.65
TRIGLYCERIDES: 127 mg/dL (ref 0.0–149.0)
Total CHOL/HDL Ratio: 2
VLDL: 25.4 mg/dL (ref 0.0–40.0)

## 2015-10-22 LAB — HEPATIC FUNCTION PANEL
ALT: 24 U/L (ref 0–53)
AST: 15 U/L (ref 0–37)
Albumin: 4.4 g/dL (ref 3.5–5.2)
Alkaline Phosphatase: 58 U/L (ref 39–117)
BILIRUBIN TOTAL: 0.6 mg/dL (ref 0.2–1.2)
Bilirubin, Direct: 0.1 mg/dL (ref 0.0–0.3)
TOTAL PROTEIN: 7.1 g/dL (ref 6.0–8.3)

## 2015-10-22 LAB — HEMOGLOBIN A1C: Hgb A1c MFr Bld: 8.4 % — ABNORMAL HIGH (ref 4.6–6.5)

## 2015-10-29 ENCOUNTER — Ambulatory Visit (INDEPENDENT_AMBULATORY_CARE_PROVIDER_SITE_OTHER): Payer: BLUE CROSS/BLUE SHIELD | Admitting: Family Medicine

## 2015-10-29 ENCOUNTER — Encounter: Payer: Self-pay | Admitting: Family Medicine

## 2015-10-29 VITALS — BP 110/66 | HR 78 | Temp 97.9°F | Wt 227.8 lb

## 2015-10-29 DIAGNOSIS — E1159 Type 2 diabetes mellitus with other circulatory complications: Secondary | ICD-10-CM

## 2015-10-29 DIAGNOSIS — E1165 Type 2 diabetes mellitus with hyperglycemia: Secondary | ICD-10-CM

## 2015-10-29 DIAGNOSIS — IMO0002 Reserved for concepts with insufficient information to code with codable children: Secondary | ICD-10-CM

## 2015-10-29 MED ORDER — METFORMIN HCL 500 MG PO TABS: 1000.0000 mg | ORAL_TABLET | Freq: Two times a day (BID) | ORAL | Status: DC

## 2015-10-29 NOTE — Progress Notes (Signed)
Pre visit review using our clinic review tool, if applicable. No additional management support is needed unless otherwise documented below in the visit note.  Weight is down, but A1c up.   He is not on a specific DM2 diet.  D/w pt.   Diabetes:  Using medications without difficulties: yes Hypoglycemic episodes:no sx Hyperglycemic episodes: no sx Feet problems: no Blood Sugars averaging: not checked.  eye exam within last year: d/w pt.  Due.   Labs d/w pt.   Meds, vitals, and allergies reviewed.   ROS: See HPI.  Otherwise negative.    GEN: nad, alert and oriented HEENT: mucous membranes moist NECK: supple w/o LA CV: rrr. PULM: ctab, no inc wob ABD: soft, +bs EXT: no edema

## 2015-10-29 NOTE — Patient Instructions (Addendum)
Up the metformin to 2 tabs twice a day.  Work on [employer] in the meantime.  Recheck labs in about 3 months.   Call about an eye exam when you get a chance.

## 2015-10-31 NOTE — Assessment & Plan Note (Signed)
Needs to continue weight loss.  Inc metformin to 2 tabs twice a day.  Work on [employer] in the meantime.  Recheck labs in about 3 months.  He agrees.

## 2015-11-20 ENCOUNTER — Encounter: Payer: Self-pay | Admitting: Cardiovascular Disease

## 2015-11-20 ENCOUNTER — Ambulatory Visit (INDEPENDENT_AMBULATORY_CARE_PROVIDER_SITE_OTHER): Payer: BLUE CROSS/BLUE SHIELD | Admitting: Cardiovascular Disease

## 2015-11-20 VITALS — BP 116/70 | HR 66 | Ht 74.0 in | Wt 224.2 lb

## 2015-11-20 DIAGNOSIS — E1159 Type 2 diabetes mellitus with other circulatory complications: Secondary | ICD-10-CM

## 2015-11-20 DIAGNOSIS — IMO0002 Reserved for concepts with insufficient information to code with codable children: Secondary | ICD-10-CM

## 2015-11-20 DIAGNOSIS — I2510 Atherosclerotic heart disease of native coronary artery without angina pectoris: Secondary | ICD-10-CM | POA: Diagnosis not present

## 2015-11-20 DIAGNOSIS — E1165 Type 2 diabetes mellitus with hyperglycemia: Secondary | ICD-10-CM

## 2015-11-20 DIAGNOSIS — I10 Essential (primary) hypertension: Secondary | ICD-10-CM

## 2015-11-20 DIAGNOSIS — Z951 Presence of aortocoronary bypass graft: Secondary | ICD-10-CM

## 2015-11-20 DIAGNOSIS — E7800 Pure hypercholesterolemia, unspecified: Secondary | ICD-10-CM

## 2015-11-20 NOTE — Progress Notes (Signed)
Patient ID: Garrett Woodard, male    DOB: [birth year], 65 y.o.   MRN: [id number]  HPI Comments: Mr. Pellicano is a 65 year old gentleman with prior smoking history, stopped in 2005, coronary artery disease with bypass surgery in 2005 with ASD closure at that time, obstructive sleep apnea, on CPAP, hyperlipidemia, diabetes type 2 with hemoglobin A1c greater than 7, who presents for follow-up of his coronary artery disease  In follow-up, he reports that he is doing well, he is lost 20 pounds since his last clinic visit He has been taking phentermine daily provided by a med spa on [address] No regular exercise, he has been trying to watch his diet Review of lab work shows dramatic drop in his cholesterol. He has stopped the Vytorin, now takes Crestor 20 mg daily.  He has stopped his metformin, feels he can do this with diet alone, has not been checking his sugars at home. Reports that the metformin prescription was called in wrong and since then he has been taking it. 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 Denies chest pain symptoms concerning for angina  EKG on today's visit shows normal sinus rhythm with rate 66 bpm, no significant ST or T-wave changes  Other past medical history No recent stress test available Prior cardiac catheterization in 2005 not available. This was done at [facility]   No Known Allergies  Current Outpatient Prescriptions on File Prior to Visit  Medication Sig Dispense Refill  . aspirin 81 MG EC tablet Take 81 mg by mouth daily.      . cyanocobalamin (,VITAMIN B-12,) 1000 MCG/ML injection Inject 1,000 mcg into the muscle once a week.    Marland Kitchen lisinopril (PRINIVIL,ZESTRIL) 10 MG tablet Take 1 tablet (10 mg total) by mouth daily. 90 tablet 3  . metoprolol tartrate (LOPRESSOR) 25 MG tablet Take 0.5 tablets (12.5 mg total) by mouth daily.  45 tablet 3  . phentermine 37.5 MG capsule Take 37.5 mg by mouth every morning.    . rosuvastatin (CRESTOR) 20 MG tablet Take 1 tablet (20 mg total) by mouth daily. 90 tablet 3   No current facility-administered medications on file prior to visit.    Past Medical History  Diagnosis Date  . Diabetes mellitus     typeII  . Hypertension   . Coronary artery disease 10/04/2003    Cabg atrial septal defect repair//[facility]) EF 70%, 3 vessell dz. (Dr.Callwood)10/04/2003  . Hyperlipidemia   . Asthma     cats    Past Surgical History  Procedure Laterality Date  . Coronary angioplasty  10/04/2003    cath.  at [facility]): EF70%, 3 vessell dz. Dr. Gwenlyn Saran wnl 11/16/2003// Exercise Cardiolite wnl EF 60% 12/03/2003  . Coronary artery bypass graft  10/2003    CABG atrial septal defect repair    Social History  reports that he quit smoking about 13 years ago. His smoking use included Cigarettes. He has never used smokeless tobacco. He reports that he drinks alcohol. He reports that he does not use illicit drugs.  Family History family history includes Alcohol abuse in his sister; Cancer in his father; Depression in his mother; Heart disease (age of onset: 63) in his father; Hypertension in his mother; Stroke in his mother. There is no history of Prostate cancer or Colon cancer.    Review of Systems  Constitutional: Negative.   Respiratory: Negative.   Cardiovascular: Negative.   Gastrointestinal: Negative.   Musculoskeletal: Negative.   Neurological: Negative.   Hematological: Negative.   Psychiatric/Behavioral: Negative.   All other systems reviewed and are negative.   BP 116/70 mmHg  Pulse 66  Ht 6\' 2"  (1.88 m)  Wt 224 lb 4 oz (101.719 kg)  BMI 28.78 kg/m2   Physical Exam  Constitutional: He is oriented to person, place, and time. He appears well-developed and well-nourished.  Obese  HENT:  Head: Normocephalic.  Nose: Nose normal.  Mouth/Throat: Oropharynx is clear  and moist.  Eyes: Conjunctivae are normal. Pupils are equal, round, and reactive to light.  Neck: Normal range of motion. Neck supple. No JVD present.  Cardiovascular: Normal rate, regular rhythm, normal heart sounds and intact distal pulses.  Exam reveals no gallop and no friction rub.   No murmur heard. Pulmonary/Chest: Effort normal and breath sounds normal. No respiratory distress. He has no wheezes. He has no rales. He exhibits no tenderness.  Abdominal: Soft. Bowel sounds are normal. He exhibits no distension. There is no tenderness.  Musculoskeletal: Normal range of motion. He exhibits no edema or tenderness.  Lymphadenopathy:    He has no cervical adenopathy.  Neurological: He is alert and oriented to person, place, and time. Coordination normal.  Skin: Skin is warm and dry. No rash noted. No erythema.  Psychiatric: He has a normal mood and affect. His behavior is normal. Judgment and thought content normal.

## 2015-11-20 NOTE — Assessment & Plan Note (Signed)
Currently with no symptoms of angina.  Stressed the importance of aggressive diabetes control

## 2015-11-20 NOTE — Assessment & Plan Note (Signed)
Cholesterol is at goal on the current lipid regimen. No changes to the medications were made.    Total encounter time more than 25 minutes  Greater than 50% was spent in counseling and coordination of care with the patient  

## 2015-11-20 NOTE — Assessment & Plan Note (Signed)
Blood pressure is well controlled on today's visit. No changes made to the medications. 

## 2015-11-20 NOTE — Assessment & Plan Note (Signed)
We have stressed the importance of aggressive diabetes control He is not taking his diabetes medications, prefers to do this with diet alone and weight loss Previous he is on metformin, is currently not taking any medication Does not check his sugars at home Long discussion today concerning the impact of poorly controlled diabetes on eyes, kidneys, vascular disease

## 2015-11-20 NOTE — Patient Instructions (Signed)
You are doing well. No medication changes were made.  Continue to monitor glucose levels  Please call us if you have new issues that need to be addressed before your next appt.  Your physician wants you to follow-up in: 6 months.  You will receive a reminder letter in the mail two months in advance. If you don't receive a letter, please call our office to schedule the follow-up appointment.

## 2015-11-20 NOTE — Assessment & Plan Note (Signed)
Currently with no symptoms of angina. No further workup at this time. Continue current medication regimen. 

## 2015-12-10 ENCOUNTER — Telehealth: Payer: Self-pay | Admitting: Cardiovascular Disease

## 2015-12-10 NOTE — Telephone Encounter (Signed)
Patient calling in with A1C results  7.1. Quit taking Metformin about 2 months ago.

## 2016-01-19 ENCOUNTER — Other Ambulatory Visit: Payer: Self-pay | Admitting: Family Medicine

## 2016-01-19 DIAGNOSIS — E1165 Type 2 diabetes mellitus with hyperglycemia: Secondary | ICD-10-CM

## 2016-01-19 DIAGNOSIS — IMO0002 Reserved for concepts with insufficient information to code with codable children: Secondary | ICD-10-CM

## 2016-01-19 DIAGNOSIS — E1159 Type 2 diabetes mellitus with other circulatory complications: Secondary | ICD-10-CM

## 2016-01-27 ENCOUNTER — Other Ambulatory Visit (INDEPENDENT_AMBULATORY_CARE_PROVIDER_SITE_OTHER): Payer: BLUE CROSS/BLUE SHIELD

## 2016-01-27 ENCOUNTER — Other Ambulatory Visit: Payer: BLUE CROSS/BLUE SHIELD

## 2016-01-27 DIAGNOSIS — E1159 Type 2 diabetes mellitus with other circulatory complications: Secondary | ICD-10-CM | POA: Diagnosis not present

## 2016-01-27 DIAGNOSIS — E1165 Type 2 diabetes mellitus with hyperglycemia: Secondary | ICD-10-CM | POA: Diagnosis not present

## 2016-01-27 DIAGNOSIS — IMO0002 Reserved for concepts with insufficient information to code with codable children: Secondary | ICD-10-CM

## 2016-01-27 LAB — HEMOGLOBIN A1C: HEMOGLOBIN A1C: 8 % — AB (ref 4.6–6.5)

## 2016-01-30 ENCOUNTER — Ambulatory Visit (INDEPENDENT_AMBULATORY_CARE_PROVIDER_SITE_OTHER): Payer: BLUE CROSS/BLUE SHIELD | Admitting: Family Medicine

## 2016-01-30 ENCOUNTER — Encounter: Payer: Self-pay | Admitting: Family Medicine

## 2016-01-30 VITALS — BP 102/62 | HR 80 | Temp 98.3°F | Ht 74.0 in | Wt 225.2 lb

## 2016-01-30 DIAGNOSIS — IMO0002 Reserved for concepts with insufficient information to code with codable children: Secondary | ICD-10-CM

## 2016-01-30 DIAGNOSIS — E1159 Type 2 diabetes mellitus with other circulatory complications: Secondary | ICD-10-CM

## 2016-01-30 DIAGNOSIS — E1165 Type 2 diabetes mellitus with hyperglycemia: Secondary | ICD-10-CM

## 2016-01-30 NOTE — Progress Notes (Signed)
Pre visit review using our clinic review tool, if applicable. No additional management support is needed unless otherwise documented below in the visit note.  Diabetes:  Off metformin in the meantime. D/w pt.   Hypoglycemic episodes:no Hyperglycemic episodes:no Feet problems:no Blood Sugars averaging: not checked.   eye exam within last year: d/w pt, due.   A1c not at goal but some better than prev.  D/w pt.  Weight is down.  He is working out consistently now, d/w pt.    Work continues to be busy.  D/w pt.    Meds, vitals, and allergies reviewed.   ROS: Per HPI unless specifically indicated in ROS section   GEN: nad, alert and oriented HEENT: mucous membranes moist NECK: supple w/o LA CV: rrr. PULM: ctab, no inc wob EXT: no edema

## 2016-01-30 NOTE — Patient Instructions (Addendum)
Keep working on your weight in the meantime and recheck labs in about 3-4 months before a physical. Stay off the metformin. Keep exercising.  Thanks for your effort. Take care.  Glad to see you.

## 2016-01-30 NOTE — Assessment & Plan Note (Signed)
Improved A1c, in spite of being off metformin.  Weight loss noted, is exercising.   A1c may lag his progress, d/w pt.   Recheck in a few months.  Continue work on diet and exercise.  He agrees.

## 2016-03-17 ENCOUNTER — Other Ambulatory Visit: Payer: Self-pay | Admitting: Family Medicine

## 2016-03-19 ENCOUNTER — Other Ambulatory Visit: Payer: Self-pay | Admitting: Cardiovascular Disease

## 2016-04-01 ENCOUNTER — Other Ambulatory Visit: Payer: Self-pay | Admitting: Family Medicine

## 2016-05-01 ENCOUNTER — Encounter: Payer: Self-pay | Admitting: Family Medicine

## 2016-05-01 ENCOUNTER — Other Ambulatory Visit: Payer: Self-pay | Admitting: Family Medicine

## 2016-05-01 DIAGNOSIS — E119 Type 2 diabetes mellitus without complications: Secondary | ICD-10-CM

## 2016-05-04 ENCOUNTER — Other Ambulatory Visit (INDEPENDENT_AMBULATORY_CARE_PROVIDER_SITE_OTHER): Payer: BLUE CROSS/BLUE SHIELD

## 2016-05-04 DIAGNOSIS — E119 Type 2 diabetes mellitus without complications: Secondary | ICD-10-CM

## 2016-05-04 LAB — COMPREHENSIVE METABOLIC PANEL
ALBUMIN: 3.9 g/dL (ref 3.5–5.2)
ALK PHOS: 58 U/L (ref 39–117)
ALT: 22 U/L (ref 0–53)
AST: 12 U/L (ref 0–37)
BILIRUBIN TOTAL: 0.5 mg/dL (ref 0.2–1.2)
BUN: 17 mg/dL (ref 6–23)
CALCIUM: 9 mg/dL (ref 8.4–10.5)
CO2: 28 mEq/L (ref 19–32)
Chloride: 104 mEq/L (ref 96–112)
Creatinine, Ser: 0.93 mg/dL (ref 0.40–1.50)
GFR: 87.2 mL/min (ref >60.00)
GLUCOSE: 301 mg/dL — AB (ref 70–99)
POTASSIUM: 4.4 meq/L (ref 3.5–5.1)
Sodium: 137 mEq/L (ref 135–145)
TOTAL PROTEIN: 6.8 g/dL (ref 6.0–8.3)

## 2016-05-04 LAB — LIPID PANEL
CHOLESTEROL: 124 mg/dL (ref 0–200)
HDL: 57 mg/dL (ref >39.00)
LDL Cholesterol: 50 mg/dL (ref 0–99)
NonHDL: 66.94
TRIGLYCERIDES: 84 mg/dL (ref 0.0–149.0)
Total CHOL/HDL Ratio: 2
VLDL: 16.8 mg/dL (ref 0.0–40.0)

## 2016-05-04 LAB — HEMOGLOBIN A1C: Hgb A1c MFr Bld: 7.9 % — ABNORMAL HIGH (ref 4.6–6.5)

## 2016-05-07 ENCOUNTER — Ambulatory Visit (INDEPENDENT_AMBULATORY_CARE_PROVIDER_SITE_OTHER): Payer: BLUE CROSS/BLUE SHIELD | Admitting: Family Medicine

## 2016-05-07 ENCOUNTER — Encounter: Payer: Self-pay | Admitting: Family Medicine

## 2016-05-07 VITALS — BP 102/60 | HR 72 | Temp 97.9°F | Ht 74.0 in | Wt 223.8 lb

## 2016-05-07 DIAGNOSIS — Z1211 Encounter for screening for malignant neoplasm of colon: Secondary | ICD-10-CM

## 2016-05-07 DIAGNOSIS — IMO0002 Reserved for concepts with insufficient information to code with codable children: Secondary | ICD-10-CM

## 2016-05-07 DIAGNOSIS — E1165 Type 2 diabetes mellitus with hyperglycemia: Secondary | ICD-10-CM

## 2016-05-07 DIAGNOSIS — E1159 Type 2 diabetes mellitus with other circulatory complications: Secondary | ICD-10-CM

## 2016-05-07 DIAGNOSIS — Z0000 Encounter for general adult medical examination without abnormal findings: Secondary | ICD-10-CM

## 2016-05-07 DIAGNOSIS — I10 Essential (primary) hypertension: Secondary | ICD-10-CM

## 2016-05-07 DIAGNOSIS — E119 Type 2 diabetes mellitus without complications: Secondary | ICD-10-CM

## 2016-05-07 MED ORDER — LISINOPRIL 10 MG PO TABS: 10.0000 mg | ORAL_TABLET | Freq: Every day | ORAL | 3 refills | Status: DC

## 2016-05-07 MED ORDER — METOPROLOL TARTRATE 25 MG PO TABS: ORAL_TABLET | ORAL | 3 refills | Status: DC

## 2016-05-07 MED ORDER — METFORMIN HCL 500 MG PO TABS: ORAL_TABLET | ORAL | 3 refills | Status: DC

## 2016-05-07 NOTE — Progress Notes (Signed)
Pre visit review using our clinic review tool, if applicable. No additional management support is needed unless otherwise documented below in the visit note. 

## 2016-05-07 NOTE — Progress Notes (Signed)
CPE- See plan.  Routine anticipatory guidance given to patient.  See health maintenance. Tetanus 2016 Flu shot done at work Shingles shot done prev.  PNA vaccine up to date.  D/w patient [id number] for colon cancer screening, including IFOB vs. colonoscopy. Risks and benefits of both were discussed and patient voiced understanding. Pt elects for: IFOB.  Diet and exercise d/w pt.   Prostate cancer screening and PSA options (with potential risks and benefits of testing vs not testing) were discussed along with recent recs/guidelines. He declined testing PSA at this point.  Living will d/w pt. Would have wife designated if incapacitated.  HIV and HCV prev done.   Hypertension, h/o CAD.  Using medication without problems or lightheadedness: yes Chest pain with exertion:no Edema:no Short of breath:no  Diabetes:  No meds Hypoglycemic episodes: no sx Hyperglycemic episodes: no sx Feet problems: no Blood Sugars averaging: not checked eye exam within last year: d/w pt.   A1c still up.  D/w pt.    PMH and SH reviewed  Meds, vitals, and allergies reviewed.   ROS: Per HPI.  Unless specifically indicated otherwise in HPI, the patient denies:  General: fever. Eyes: acute vision changes ENT: sore throat Cardiovascular: chest pain Respiratory: SOB GI: vomiting GU: dysuria Musculoskeletal: acute back pain Derm: acute rash Neuro: acute motor dysfunction Psych: worsening mood Endocrine: polydipsia Heme: bleeding Allergy: hayfever  GEN: nad, alert and oriented HEENT: mucous membranes moist NECK: supple w/o LA CV: rrr. PULM: ctab, no inc wob ABD: soft, +bs EXT: no edema SKIN: no acute rash  Diabetic foot exam: Normal inspection No skin breakdown No calluses  Normal DP pulses Normal sensation to light touch and monofilament Nails normal

## 2016-05-07 NOTE — Patient Instructions (Signed)
Recheck A1c in 3 months.  Add back 1-2 metformin a day.  Take care.  Glad to see you.  Update me as needed.

## 2016-05-08 NOTE — Assessment & Plan Note (Signed)
Controlled. No change in meds. Recent labs discussed with patient. Continue work on diet and exercise.

## 2016-05-08 NOTE — Assessment & Plan Note (Signed)
Discussed with patient about more aggressive diabetic control. Restart metformin. He can take up to 4 times a day, prescription sent. I will start with one tablet a for about a week. If tolerated increase to 1 tab twice a day. Recheck A1c in about 3 months. He does not have to have office visit at that point necessarily. We will get his follow-up A1c and go from there. Continue work on diet and exercise. He agrees.

## 2016-05-08 NOTE — Assessment & Plan Note (Signed)
Tetanus 2016 Flu shot done at work Shingles shot done prev.  PNA vaccine up to date.  D/w patient [id number] for colon cancer screening, including IFOB vs. colonoscopy. Risks and benefits of both were discussed and patient voiced understanding. Pt elects for: IFOB.  Diet and exercise d/w pt.   Prostate cancer screening and PSA options (with potential risks and benefits of testing vs not testing) were discussed along with recent recs/guidelines. He declined testing PSA at this point.  Living will d/w pt. Would have wife designated if incapacitated.  HIV and HCV prev done.

## 2016-05-12 ENCOUNTER — Encounter: Payer: Self-pay | Admitting: Cardiovascular Disease

## 2016-05-12 ENCOUNTER — Ambulatory Visit (INDEPENDENT_AMBULATORY_CARE_PROVIDER_SITE_OTHER): Payer: BLUE CROSS/BLUE SHIELD | Admitting: Cardiovascular Disease

## 2016-05-12 VITALS — BP 108/70 | HR 70 | Ht 74.0 in | Wt 225.8 lb

## 2016-05-12 DIAGNOSIS — E1159 Type 2 diabetes mellitus with other circulatory complications: Secondary | ICD-10-CM

## 2016-05-12 DIAGNOSIS — I2510 Atherosclerotic heart disease of native coronary artery without angina pectoris: Secondary | ICD-10-CM | POA: Diagnosis not present

## 2016-05-12 DIAGNOSIS — Z951 Presence of aortocoronary bypass graft: Secondary | ICD-10-CM

## 2016-05-12 DIAGNOSIS — IMO0002 Reserved for concepts with insufficient information to code with codable children: Secondary | ICD-10-CM

## 2016-05-12 DIAGNOSIS — I10 Essential (primary) hypertension: Secondary | ICD-10-CM

## 2016-05-12 DIAGNOSIS — E7800 Pure hypercholesterolemia, unspecified: Secondary | ICD-10-CM | POA: Diagnosis not present

## 2016-05-12 DIAGNOSIS — E1165 Type 2 diabetes mellitus with hyperglycemia: Secondary | ICD-10-CM

## 2016-05-12 NOTE — Progress Notes (Signed)
Cardiology Office Note  Date:  05/12/2016   ID:  Obra, Burckhard Jan 07, 1951, MRN [id number]  PCP:  Elsie Stain, MD   Chief Complaint  Patient presents with  . other    6 month follow up. Patient states he is going well. Meds reviewed with patient verbally.     HPI:  Garrett Woodard is a 65 year old gentleman with prior smoking history, stopped in 2005, coronary artery disease with bypass surgery in 2005 with ASD closure at that time, obstructive sleep apnea, on CPAP, hyperlipidemia, diabetes type 2 with hemoglobin A1c greater than 7, who presents for follow-up of his coronary artery disease  In follow-up, he reports he is doing well Goes to gym 4 x a week 30 min on treadmill, 2 miles No sx of chest pain or shortness of breath on exertion  Lab work reviewed with him HBA1C 7.9 Takes metformin Unable to lose more weight despite working out on a regular basis, watching his diet  Tolerating Crestor 20 mg daily. Total cholesterol 120s, LDL 50s  EKG on today's visit shows normal sinus rhythm, no significant ST or T-wave changes  Other past medical history No recent stress test available Prior cardiac catheterization in 2005 not available. This was done at [facility]   PMH:   has a past medical history of Asthma; Coronary artery disease (10/04/2003); Diabetes mellitus; Hyperlipidemia; and Hypertension.  PSH:    Past Surgical History:  Procedure Laterality Date  . CORONARY ANGIOPLASTY  10/04/2003   cath.  at [facility]): EF70%, 3 vessell dz. Dr. Gwenlyn Saran wnl 11/16/2003// Exercise Cardiolite wnl EF 60% 12/03/2003  . CORONARY ARTERY BYPASS GRAFT  10/2003   CABG atrial septal defect repair    Current Outpatient Prescriptions  Medication Sig Dispense Refill  . aspirin 81 MG EC tablet Take 81 mg by mouth daily.      . cyanocobalamin (,VITAMIN B-12,) 1000 MCG/ML injection Inject 1,000 mcg into the muscle once a week.    Marland Kitchen lisinopril (PRINIVIL,ZESTRIL) 10 MG tablet Take 1  tablet (10 mg total) by mouth daily. 90 tablet 3  . metFORMIN (GLUCOPHAGE) 500 MG tablet Take up to 2 tabs twice a day. 360 tablet 3  . metoprolol tartrate (LOPRESSOR) 25 MG tablet TAKE 1/2 TABLET ONCE A DAY 45 tablet 3  . phentermine 37.5 MG capsule Take 37.5 mg by mouth every morning.    . rosuvastatin (CRESTOR) 20 MG tablet TAKE 1 TABLET BY MOUTH DAILY 90 tablet 3   No current facility-administered medications for this visit.      Allergies:   Review of patient's allergies indicates no known allergies.   Social History:  The patient  reports that he quit smoking about 13 years ago. His smoking use included Cigarettes. He has never used smokeless tobacco. He reports that he drinks alcohol. He reports that he does not use drugs.   Family History:   family history includes Alcohol abuse in his sister; Cancer in his father; Depression in his mother; Heart disease (age of onset: 34) in his father; Hypertension in his mother; Stroke in his mother.    Review of Systems: Review of Systems  Constitutional: Negative.   Respiratory: Negative.   Cardiovascular: Negative.   Gastrointestinal: Negative.   Musculoskeletal: Negative.   Neurological: Negative.   Psychiatric/Behavioral: Negative.   All other systems reviewed and are negative.    PHYSICAL EXAM: VS:  BP 108/70 (BP Location: Left Arm, Patient Position: Sitting, Cuff Size: Normal)   Pulse 70  Ht 6\' 2"  (1.88 m)   Wt 225 lb 12 oz (102.4 kg)   BMI 28.98 kg/m  , BMI Body mass index is 28.98 kg/m. GEN: Well nourished, well developed, in no acute distress  HEENT: normal  Neck: no JVD, carotid bruits, or masses Cardiac: RRR; no murmurs, rubs, or gallops,no edema  Respiratory:  clear to auscultation bilaterally, normal work of breathing GI: soft, nontender, nondistended, + BS MS: no deformity or atrophy  Skin: warm and dry, no rash Neuro:  Strength and sensation are intact Psych: euthymic mood, full affect    Recent  Labs: 05/04/2016: ALT 22; BUN 17; Creatinine, Ser 0.93; Potassium 4.4; Sodium 137    Lipid Panel Lab Results  Component Value Date   CHOL 124 05/04/2016   HDL 57.00 05/04/2016   LDLCALC 50 05/04/2016   TRIG 84.0 05/04/2016      Wt Readings from Last 3 Encounters:  05/12/16 225 lb 12 oz (102.4 kg)  05/07/16 223 lb 12 oz (101.5 kg)  01/30/16 225 lb 4 oz (102.2 kg)       ASSESSMENT AND PLAN:  Essential hypertension, benign - Plan: EKG 12-Lead Blood pressure is well controlled on today's visit. No changes made to the medications.  Coronary artery disease involving native coronary artery of native heart without angina pectoris - Plan: EKG 12-Lead Currently with no symptoms of angina. No further workup at this time. Continue current medication regimen.  Pure hypercholesterolemia Cholesterol is at goal on the current lipid regimen. No changes to the medications were made.  Uncontrolled type 2 diabetes mellitus with other circulatory complication, without long-term current use of insulin ([place]) Long discussion concerning his elevated hemoglobin A1c May need different medications to achieve goal in the 6 range  S/P CABG (coronary artery bypass graft) Denies any anginal symptoms   Total encounter time more than 15 minutes  Greater than 50% was spent in counseling and coordination of care with the patient   Disposition:   F/U  6 months   Orders Placed This Encounter  Procedures  . EKG 12-Lead     Signed, Garrett Woodard, M.D., Ph.D. 05/12/2016  [place], [place]

## 2016-05-12 NOTE — Patient Instructions (Signed)

## 2016-05-14 ENCOUNTER — Other Ambulatory Visit (INDEPENDENT_AMBULATORY_CARE_PROVIDER_SITE_OTHER): Payer: BLUE CROSS/BLUE SHIELD

## 2016-05-14 DIAGNOSIS — Z1211 Encounter for screening for malignant neoplasm of colon: Secondary | ICD-10-CM

## 2016-05-14 LAB — FECAL OCCULT BLOOD, IMMUNOCHEMICAL: FECAL OCCULT BLD: NEGATIVE

## 2016-08-11 ENCOUNTER — Other Ambulatory Visit (INDEPENDENT_AMBULATORY_CARE_PROVIDER_SITE_OTHER): Payer: BLUE CROSS/BLUE SHIELD

## 2016-08-11 DIAGNOSIS — E119 Type 2 diabetes mellitus without complications: Secondary | ICD-10-CM

## 2016-08-11 LAB — HEMOGLOBIN A1C: HEMOGLOBIN A1C: 8.1 % — AB (ref 4.6–6.5)

## 2016-11-07 NOTE — Progress Notes (Signed)
Cardiology Office Note  Date:  11/09/2016   ID:  Garrett Woodard, Garrett Woodard 1950/09/19, MRN [id number]  PCP:  Elsie Stain, MD   Chief Complaint  Patient presents with  . other    6 month follow up. Meds reviewed by the pt. verbally. "doing well."     HPI:  Garrett Woodard is a 66 year old gentleman with  smoking history, stopped in 2005,  coronary artery disease with CABG in 2005 , s/p ASD ,  obstructive sleep apnea, on CPAP,  hyperlipidemia,  diabetes type 2 with hemoglobin A1c greater than 8,  who presents for follow-up of his coronary artery disease Garrett Woodard   Reports that he no longer takes phentermine or B-12 Has noticed a difference, less energy But otherwise overall feels well Continues to Go Go to gym 4 x a week 30 min on treadmill, 2 miles No sx of chest pain or shortness of breath on exertion  Lab work reviewed with him HBA1C 8.1, Consistently moderately elevated Takes metformin, Does not really want to go to higher dose, interested in other medication options such as victoza Unable to lose more weight despite working out on a regular basis, watching his diet  Tolerating Crestor 20 mg daily. Total cholesterol 120s, LDL 50s Numbers reviewed in detail with him  EKG on today's visit shows normal sinus rhythm, With rate 69 bpm, APCs, no significant ST or T-wave changes  Other past medical history No recent stress test available Prior cardiac catheterization in 2005 not available. This was done at [facility]   PMH:   has a past medical history of Asthma; Coronary artery disease (10/04/2003); Diabetes mellitus; Hyperlipidemia; and Hypertension.  PSH:    Past Surgical History:  Procedure Laterality Date  . CORONARY ANGIOPLASTY  10/04/2003   cath.  at [facility]): EF70%, 3 vessell dz. Dr. Gwenlyn Saran wnl 11/16/2003// Exercise Cardiolite wnl EF 60% 12/03/2003  . CORONARY ARTERY BYPASS GRAFT  10/2003   CABG atrial septal defect repair    Current Outpatient  Prescriptions  Medication Sig Dispense Refill  . aspirin 81 MG EC tablet Take 81 mg by mouth daily.      Marland Kitchen lisinopril (PRINIVIL,ZESTRIL) 10 MG tablet Take 1 tablet (10 mg total) by mouth daily. 90 tablet 3  . metFORMIN (GLUCOPHAGE) 500 MG tablet Take up to 2 tabs twice a day. 360 tablet 3  . metoprolol tartrate (LOPRESSOR) 25 MG tablet TAKE 1/2 TABLET ONCE A DAY 45 tablet 3  . rosuvastatin (CRESTOR) 20 MG tablet TAKE 1 TABLET BY MOUTH DAILY 90 tablet 3   No current facility-administered medications for this visit.      Allergies:   Patient has no known allergies.   Social History:  The patient  reports that he quit smoking about 14 years ago. His smoking use included Cigarettes. He has never used smokeless tobacco. He reports that he drinks alcohol. He reports that he does not use drugs.   Family History:   family history includes Alcohol abuse in his sister; Cancer in his father; Depression in his mother; Heart disease (age of onset: 75) in his father; Hypertension in his mother; Stroke in his mother.    Review of Systems: Review of Systems  Constitutional: Negative.   Respiratory: Negative.   Cardiovascular: Negative.   Gastrointestinal: Negative.   Musculoskeletal: Negative.   Neurological: Negative.   Psychiatric/Behavioral: Negative.   All other systems reviewed and are negative.    PHYSICAL EXAM: VS:  BP 126/64 (BP Location: Left  Arm, Patient Position: Sitting, Cuff Size: Normal)   Pulse 69   Ht 6' (1.829 m)   Wt 230 lb 4 oz (104.4 kg)   BMI 31.23 kg/m  , BMI Body mass index is 31.23 kg/m. GEN: Well nourished, well developed, in no acute distress  HEENT: normal  Neck: no JVD, carotid bruits, or masses Cardiac: RRR; no murmurs, rubs, or gallops,no edema  Respiratory:  clear to auscultation bilaterally, normal work of breathing GI: soft, nontender, nondistended, + BS MS: no deformity or atrophy  Skin: warm and dry, no rash Neuro:  Strength and sensation are  intact Psych: euthymic mood, full affect    Recent Labs: 05/04/2016: ALT 22; BUN 17; Creatinine, Ser 0.93; Potassium 4.4; Sodium 137    Lipid Panel Lab Results  Component Value Date   CHOL 124 05/04/2016   HDL 57.00 05/04/2016   LDLCALC 50 05/04/2016   TRIG 84.0 05/04/2016      Wt Readings from Last 3 Encounters:  11/09/16 230 lb 4 oz (104.4 kg)  05/12/16 225 lb 12 oz (102.4 kg)  05/07/16 223 lb 12 oz (101.5 kg)       ASSESSMENT AND PLAN:  Essential hypertension, benign Blood pressure is well controlled on today's visit. No changes made to the medications.  Mixed hyperlipidemia Cholesterol is at goal on the current lipid regimen. No changes to the medications were made.  Coronary artery disease of native artery of native heart with stable angina pectoris ([place]) Currently with no symptoms of angina. No further workup at this time. Continue current medication regimen.  Uncontrolled type 2 diabetes mellitus with other circulatory complication, without long-term current use of insulin ([place]) Long discussion with him, A1c typically 8 He is exercising, trying to watch what he eats He is interested in other medication options such as adding victoza. Does not really want to go up on higher dose metformin  Sleep apnea, unspecified type  S/P CABG (coronary artery bypass graft) Denies any anginal symptoms Continues to exercise on a regular basis without exertional chest pain  Disposition:   F/U  6 months   Total encounter time more than 25 minutes  Greater than 50% was spent in counseling and coordination of care with the patient   No orders of the defined types were placed in this encounter.    Signed, Esmond Plants, M.D., Ph.D. 11/09/2016  [place], [place]

## 2016-11-09 ENCOUNTER — Ambulatory Visit (INDEPENDENT_AMBULATORY_CARE_PROVIDER_SITE_OTHER): Payer: BLUE CROSS/BLUE SHIELD | Admitting: Cardiovascular Disease

## 2016-11-09 ENCOUNTER — Encounter: Payer: Self-pay | Admitting: Cardiovascular Disease

## 2016-11-09 VITALS — BP 126/64 | HR 69 | Ht 72.0 in | Wt 230.2 lb

## 2016-11-09 DIAGNOSIS — I10 Essential (primary) hypertension: Secondary | ICD-10-CM

## 2016-11-09 DIAGNOSIS — E1159 Type 2 diabetes mellitus with other circulatory complications: Secondary | ICD-10-CM | POA: Diagnosis not present

## 2016-11-09 DIAGNOSIS — E782 Mixed hyperlipidemia: Secondary | ICD-10-CM | POA: Diagnosis not present

## 2016-11-09 DIAGNOSIS — I25118 Atherosclerotic heart disease of native coronary artery with other forms of angina pectoris: Secondary | ICD-10-CM

## 2016-11-09 DIAGNOSIS — E1165 Type 2 diabetes mellitus with hyperglycemia: Secondary | ICD-10-CM

## 2016-11-09 DIAGNOSIS — IMO0002 Reserved for concepts with insufficient information to code with codable children: Secondary | ICD-10-CM

## 2016-11-09 DIAGNOSIS — Z951 Presence of aortocoronary bypass graft: Secondary | ICD-10-CM | POA: Diagnosis not present

## 2016-11-09 DIAGNOSIS — G4730 Sleep apnea, unspecified: Secondary | ICD-10-CM | POA: Diagnosis not present

## 2016-11-09 NOTE — Patient Instructions (Signed)

## 2017-05-02 ENCOUNTER — Other Ambulatory Visit: Payer: Self-pay | Admitting: Family Medicine

## 2017-05-02 DIAGNOSIS — E1165 Type 2 diabetes mellitus with hyperglycemia: Secondary | ICD-10-CM

## 2017-05-02 DIAGNOSIS — IMO0002 Reserved for concepts with insufficient information to code with codable children: Secondary | ICD-10-CM

## 2017-05-02 DIAGNOSIS — E1159 Type 2 diabetes mellitus with other circulatory complications: Secondary | ICD-10-CM

## 2017-05-05 ENCOUNTER — Other Ambulatory Visit (INDEPENDENT_AMBULATORY_CARE_PROVIDER_SITE_OTHER): Payer: BLUE CROSS/BLUE SHIELD

## 2017-05-05 DIAGNOSIS — E1165 Type 2 diabetes mellitus with hyperglycemia: Secondary | ICD-10-CM | POA: Diagnosis not present

## 2017-05-05 DIAGNOSIS — E1159 Type 2 diabetes mellitus with other circulatory complications: Secondary | ICD-10-CM

## 2017-05-05 DIAGNOSIS — IMO0002 Reserved for concepts with insufficient information to code with codable children: Secondary | ICD-10-CM

## 2017-05-05 LAB — COMPREHENSIVE METABOLIC PANEL
ALK PHOS: 60 U/L (ref 39–117)
ALT: 22 U/L (ref 0–53)
AST: 14 U/L (ref 0–37)
Albumin: 4.4 g/dL (ref 3.5–5.2)
BUN: 15 mg/dL (ref 6–23)
CHLORIDE: 104 meq/L (ref 96–112)
CO2: 28 mEq/L (ref 19–32)
Calcium: 9.3 mg/dL (ref 8.4–10.5)
Creatinine, Ser: 0.91 mg/dL (ref 0.40–1.50)
GFR: 89.12 mL/min (ref >60.00)
Glucose, Bld: 184 mg/dL — ABNORMAL HIGH (ref 70–99)
POTASSIUM: 4.7 meq/L (ref 3.5–5.1)
SODIUM: 138 meq/L (ref 135–145)
Total Bilirubin: 0.6 mg/dL (ref 0.2–1.2)
Total Protein: 7.2 g/dL (ref 6.0–8.3)

## 2017-05-05 LAB — LIPID PANEL
CHOLESTEROL: 106 mg/dL (ref 0–200)
HDL: 50.7 mg/dL (ref >39.00)
LDL CALC: 35 mg/dL (ref 0–99)
NonHDL: 55.62
TRIGLYCERIDES: 105 mg/dL (ref 0.0–149.0)
Total CHOL/HDL Ratio: 2
VLDL: 21 mg/dL (ref 0.0–40.0)

## 2017-05-05 LAB — HEMOGLOBIN A1C: Hgb A1c MFr Bld: 7.7 % — ABNORMAL HIGH (ref 4.6–6.5)

## 2017-05-10 ENCOUNTER — Ambulatory Visit (INDEPENDENT_AMBULATORY_CARE_PROVIDER_SITE_OTHER): Payer: BLUE CROSS/BLUE SHIELD | Admitting: Family Medicine

## 2017-05-10 ENCOUNTER — Encounter: Payer: Self-pay | Admitting: Family Medicine

## 2017-05-10 VITALS — BP 102/64 | HR 79 | Temp 98.0°F | Ht 72.0 in | Wt 223.8 lb

## 2017-05-10 DIAGNOSIS — Z0000 Encounter for general adult medical examination without abnormal findings: Secondary | ICD-10-CM

## 2017-05-10 DIAGNOSIS — E1159 Type 2 diabetes mellitus with other circulatory complications: Secondary | ICD-10-CM | POA: Diagnosis not present

## 2017-05-10 DIAGNOSIS — E7800 Pure hypercholesterolemia, unspecified: Secondary | ICD-10-CM | POA: Diagnosis not present

## 2017-05-10 DIAGNOSIS — Z1211 Encounter for screening for malignant neoplasm of colon: Secondary | ICD-10-CM | POA: Diagnosis not present

## 2017-05-10 DIAGNOSIS — I10 Essential (primary) hypertension: Secondary | ICD-10-CM | POA: Diagnosis not present

## 2017-05-10 MED ORDER — ROSUVASTATIN CALCIUM 20 MG PO TABS: 20.0000 mg | ORAL_TABLET | Freq: Every day | ORAL | 3 refills | Status: DC

## 2017-05-10 MED ORDER — LISINOPRIL 10 MG PO TABS: 10.0000 mg | ORAL_TABLET | Freq: Every day | ORAL | 3 refills | Status: DC

## 2017-05-10 MED ORDER — ALBUTEROL SULFATE HFA 108 (90 BASE) MCG/ACT IN AERS: 1.0000 | INHALATION_SPRAY | Freq: Four times a day (QID) | RESPIRATORY_TRACT | 1 refills | Status: DC | PRN

## 2017-05-10 MED ORDER — METOPROLOL TARTRATE 25 MG PO TABS: ORAL_TABLET | ORAL | 3 refills | Status: DC

## 2017-05-10 MED ORDER — METFORMIN HCL 500 MG PO TABS: ORAL_TABLET | ORAL | 3 refills | Status: DC

## 2017-05-10 NOTE — Progress Notes (Signed)
CPE- See plan.  Routine anticipatory guidance given to patient.  See health maintenance.  The possibility exists that previously documented standard health maintenance information may have been brought forward from a previous encounter into this note.  If needed, that same information has been updated to reflect the current situation based on today's encounter.    Tetanus 2016 Flu shot to be done at work Shingles shot done prev PNA vaccine up to date D/w patient [id number] for colon cancer screening, including IFOB vs. colonoscopy. Risks and benefits of both were discussed and patient voiced understanding. Pt elects for: IFOB.  Diet and exercise d/w pt.  Prostate cancer screening and PSA options (with potential risks and benefits of testing vs not testing) were discussed along with recent recs/guidelines. He declined testing PSA at this point. He has minimal LUTS with occ double voiding, per patient not bothersome enough to treat at this point.   Living will d/w pt. Would have wife designated if incapacitated.  HIV and HCV prev done.  He has <30 pack year hx of smoking, so he didn't qualify for lung cancer screening threshold.  D/w pt.   He rarely uses SABA with exposure to cats, w/o ADE.  D/w pt.    He is still on B12 and phentermine per outside clinic.    Diabetes:  Using medications without difficulties: yes Hypoglycemic episodes:no sx Hyperglycemic episodes: no sx Feet problems:no Blood Sugars averaging: not checked ats home.   eye exam within last year: d/w pt.  Due.   A1c slightly better than prev, d/w pt.    Hypertension:    Using medication without problems or lightheadedness: yes Chest pain with exertion:no Edema:no Short of breath:no  Elevated Cholesterol: Using medications without problems: yes Muscle aches: no Diet compliance: encouraged Exercise: encouraged Labs d/w pt.    PMH and SH reviewed  Meds, vitals, and allergies reviewed.   ROS: Per HPI.   Unless specifically indicated otherwise in HPI, the patient denies:  General: fever. Eyes: acute vision changes ENT: sore throat Cardiovascular: chest pain Respiratory: SOB GI: vomiting GU: dysuria Musculoskeletal: acute back pain Derm: acute rash Neuro: acute motor dysfunction Psych: worsening mood Endocrine: polydipsia Heme: bleeding Allergy: hayfever  GEN: nad, alert and oriented HEENT: mucous membranes moist NECK: supple w/o LA CV: rrr. PULM: ctab, no inc wob ABD: soft, +bs EXT: no edema SKIN: no acute rash  Diabetic foot exam: Normal inspection No skin breakdown No calluses  Normal DP pulses Normal sensation to light touch and monofilament Nails normal

## 2017-05-10 NOTE — Patient Instructions (Addendum)
Go to the lab on the way out.  We'll contact you with your lab report. Take care.  Glad to see you.  Update me as needed.  Recheck in about 6 months.

## 2017-05-11 NOTE — Assessment & Plan Note (Signed)
Controlled. Needs continued work on weight reduction with diet and exercise. Discussed with patient. No change in meds.

## 2017-05-11 NOTE — Assessment & Plan Note (Signed)
Tetanus 2016 Flu shot to be done at work Shingles shot done prev PNA vaccine up to date D/w patient [id number] for colon cancer screening, including IFOB vs. colonoscopy. Risks and benefits of both were discussed and patient voiced understanding. Pt elects for: IFOB.  Diet and exercise d/w pt.  Prostate cancer screening and PSA options (with potential risks and benefits of testing vs not testing) were discussed along with recent recs/guidelines. He declined testing PSA at this point. He has minimal LUTS with occ double voiding, per patient not bothersome enough to treat at this point.   Living will d/w pt. Would have wife designated if incapacitated.  HIV and HCV prev done.  He has <30 pack year hx of smoking, so he didn't qualify for lung cancer screening threshold.  D/w pt.

## 2017-05-11 NOTE — Assessment & Plan Note (Signed)
A1c some better. No adverse effect on medication. Needs continued work on weight reduction with diet and exercise. Discussed with patient. Recheck periodically. He agrees.

## 2017-05-11 NOTE — Assessment & Plan Note (Signed)
Controlled, no change in meds.  Needs continued work on weight reduction with diet and exercise. Discussed with patient.

## 2017-05-17 ENCOUNTER — Ambulatory Visit: Payer: BLUE CROSS/BLUE SHIELD | Admitting: Cardiovascular Disease

## 2017-05-21 ENCOUNTER — Other Ambulatory Visit (INDEPENDENT_AMBULATORY_CARE_PROVIDER_SITE_OTHER): Payer: BLUE CROSS/BLUE SHIELD

## 2017-05-21 DIAGNOSIS — Z1211 Encounter for screening for malignant neoplasm of colon: Secondary | ICD-10-CM | POA: Diagnosis not present

## 2017-05-21 LAB — FECAL OCCULT BLOOD, IMMUNOCHEMICAL: Fecal Occult Bld: NEGATIVE

## 2017-05-24 ENCOUNTER — Encounter: Payer: Self-pay | Admitting: *Deleted

## 2017-05-24 NOTE — Progress Notes (Signed)
Cardiology Office Note  Date:  05/28/2017   ID:  Garrett Woodard, Garrett Woodard 12-08-1950, MRN [id number]  PCP:  Tonia Ghent, MD   Chief Complaint  Patient presents with  . other    6 month follow up. Meds reviewed by the pt. verbally. "doing well."     HPI:  Garrett Woodard is a 66 year old gentleman with  smoking history, stopped in 2005,  coronary artery disease with CABG in 2005 , s/p ASD ,  Previous anginal sx with nausea, ache in jaw, tingling in arm obstructive sleep apnea, on CPAP,  hyperlipidemia,  diabetes type 2 with hemoglobin A1c greater than 8,  who presents for follow-up of his coronary artery disease Hobbies include fishing and musician   Reports he is back on B-12 Feels well, Continues to  Go to gym, uses treadmill No sx of chest pain or shortness of breath on exertion Denies any of his anginal symptoms including nausea, jaw pain, tingling in his arms  Lab work reviewed with him HBA1C 7.7,  Total chol 106, LDL 35 Tolerating Crestor  EKG on today's visit shows normal sinus rhythm, With rate 80 bpm, APCs, no significant ST or T-wave changes  Other past medical history No recent stress test available Prior cardiac catheterization in 2005 not available. This was done at [facility]   PMH:   has a past medical history of Allergy; Asthma; Coronary artery disease (10/04/2003); Diabetes mellitus; Hyperlipidemia; and Hypertension.  PSH:    Past Surgical History:  Procedure Laterality Date  . CORONARY ANGIOPLASTY  10/04/2003   cath.  at [facility]): EF70%, 3 vessell dz. Dr. Gwenlyn Saran wnl 11/16/2003// Exercise Cardiolite wnl EF 60% 12/03/2003  . CORONARY ARTERY BYPASS GRAFT  10/2003   CABG atrial septal defect repair    Current Outpatient Prescriptions  Medication Sig Dispense Refill  . albuterol (PROAIR HFA) 108 (90 Base) MCG/ACT inhaler Inhale 1 puff into the lungs every 6 (six) hours as needed for wheezing. 18 g 1  . aspirin 81 MG EC tablet Take 81 mg by  mouth daily.      . cyanocobalamin (,VITAMIN B-12,) 1000 MCG/ML injection Inject 1,000 mcg into the muscle once a week.    Marland Kitchen lisinopril (PRINIVIL,ZESTRIL) 10 MG tablet Take 1 tablet (10 mg total) by mouth daily. 90 tablet 3  . metFORMIN (GLUCOPHAGE) 500 MG tablet Take up to 2 tabs twice a day. 360 tablet 3  . metoprolol tartrate (LOPRESSOR) 25 MG tablet TAKE 1/2 TABLET ONCE A DAY 45 tablet 3  . phentermine 30 MG capsule Take 30 mg by mouth every morning.    . rosuvastatin (CRESTOR) 20 MG tablet Take 1 tablet (20 mg total) by mouth daily. 90 tablet 3   No current facility-administered medications for this visit.      Allergies:   Patient has no known allergies.   Social History:  The patient  reports that he quit smoking about 14 years ago. His smoking use included Cigarettes. He has never used smokeless tobacco. He reports that he drinks alcohol. He reports that he does not use drugs.   Family History:   family history includes Alcohol abuse in his sister; Cancer in his father; Depression in his mother; Heart disease (age of onset: 7) in his father; Hypertension in his mother; Stroke in his mother.    Review of Systems: Review of Systems  Constitutional: Negative.   Respiratory: Negative.   Cardiovascular: Negative.   Gastrointestinal: Negative.   Musculoskeletal: Negative.  Neurological: Negative.   Psychiatric/Behavioral: Negative.   All other systems reviewed and are negative. No change in his review of systems   PHYSICAL EXAM: VS:  BP 102/68 (BP Location: Left Arm, Patient Position: Sitting, Cuff Size: Normal)   Pulse 80   Ht 6\' 2"  (1.88 m)   Wt 226 lb 8 oz (102.7 kg)   BMI 29.08 kg/m  , BMI Body mass index is 29.08 kg/m. GEN: Well nourished, well developed, in no acute distress  HEENT: normal  Neck: no JVD, carotid bruits, or masses Cardiac: RRR; no murmurs, rubs, or gallops,no edema  Respiratory:  clear to auscultation bilaterally, normal work of breathing GI:  soft, nontender, nondistended, + BS MS: no deformity or atrophy  Skin: warm and dry, no rash Neuro:  Strength and sensation are intact Psych: euthymic mood, full affect    Recent Labs: 05/05/2017: ALT 22; BUN 15; Creatinine, Ser 0.91; Potassium 4.7; Sodium 138    Lipid Panel Lab Results  Component Value Date   CHOL 106 05/05/2017   HDL 50.70 05/05/2017   LDLCALC 35 05/05/2017   TRIG 105.0 05/05/2017      Wt Readings from Last 3 Encounters:  05/28/17 226 lb 8 oz (102.7 kg)  05/10/17 223 lb 12 oz (101.5 kg)  11/09/16 230 lb 4 oz (104.4 kg)       ASSESSMENT AND PLAN:  Essential hypertension, benign Blood pressure low, he denies orthostasis Recommended if he does develop orthostasis symptoms that he calls our office Would recommend he stay hydrated  Mixed hyperlipidemia Cholesterol is at goal on the current lipid regimen. No changes to the medications were made. Stable  Coronary artery disease of native artery of native heart with stable angina pectoris (HCC) Currently with no symptoms of angina. No further workup at this time. Continue current medication regimen.  Discussed previous anginal symptoms.  Stable  Uncontrolled type 2 diabetes mellitus with other circulatory complication, without long-term current use of insulin (HCC) Hemoglobin A1c down to 7.7 Previously in the 8 range Still exercising, trying to watch his diet  Sleep apnea, unspecified type  S/P CABG (coronary artery bypass graft) No anginal symptoms, no further testing at this time Discussed previous surgery 2005  Disposition:   F/U  12 months   Total encounter time more than 25 minutes  Greater than 50% was spent in counseling and coordination of care with the patient    Orders Placed This Encounter  Procedures  . EKG 12-Lead     Signed, Esmond Plants, M.D., Ph.D. 05/28/2017  [facility], [place]

## 2017-05-28 ENCOUNTER — Ambulatory Visit (INDEPENDENT_AMBULATORY_CARE_PROVIDER_SITE_OTHER): Payer: BLUE CROSS/BLUE SHIELD | Admitting: Cardiovascular Disease

## 2017-05-28 ENCOUNTER — Encounter: Payer: Self-pay | Admitting: Cardiovascular Disease

## 2017-05-28 VITALS — BP 102/68 | HR 80 | Ht 74.0 in | Wt 226.5 lb

## 2017-05-28 DIAGNOSIS — I25118 Atherosclerotic heart disease of native coronary artery with other forms of angina pectoris: Secondary | ICD-10-CM | POA: Diagnosis not present

## 2017-05-28 DIAGNOSIS — I10 Essential (primary) hypertension: Secondary | ICD-10-CM | POA: Diagnosis not present

## 2017-05-28 DIAGNOSIS — E7800 Pure hypercholesterolemia, unspecified: Secondary | ICD-10-CM | POA: Diagnosis not present

## 2017-05-28 DIAGNOSIS — G4730 Sleep apnea, unspecified: Secondary | ICD-10-CM

## 2017-05-28 DIAGNOSIS — Z951 Presence of aortocoronary bypass graft: Secondary | ICD-10-CM

## 2017-05-28 DIAGNOSIS — E119 Type 2 diabetes mellitus without complications: Secondary | ICD-10-CM | POA: Diagnosis not present

## 2017-05-28 NOTE — Patient Instructions (Signed)

## 2017-11-03 ENCOUNTER — Other Ambulatory Visit: Payer: Self-pay | Admitting: Family Medicine

## 2017-11-03 DIAGNOSIS — E119 Type 2 diabetes mellitus without complications: Secondary | ICD-10-CM

## 2017-11-04 ENCOUNTER — Other Ambulatory Visit (INDEPENDENT_AMBULATORY_CARE_PROVIDER_SITE_OTHER): Payer: BLUE CROSS/BLUE SHIELD

## 2017-11-04 DIAGNOSIS — E119 Type 2 diabetes mellitus without complications: Secondary | ICD-10-CM | POA: Diagnosis not present

## 2017-11-04 LAB — HEMOGLOBIN A1C: HEMOGLOBIN A1C: 7 % — AB (ref 4.6–6.5)

## 2017-11-08 ENCOUNTER — Encounter: Payer: Self-pay | Admitting: Family Medicine

## 2017-11-08 ENCOUNTER — Ambulatory Visit: Payer: BLUE CROSS/BLUE SHIELD | Admitting: Family Medicine

## 2017-11-08 DIAGNOSIS — E119 Type 2 diabetes mellitus without complications: Secondary | ICD-10-CM | POA: Diagnosis not present

## 2017-11-08 NOTE — Patient Instructions (Addendum)
Call about an eye exam when possible.   Recheck in about 6 months at a physical.  Stop the lisinopril in the meantime.   If you have any episodes with lower sugars then cut back on the metformin.  Update me as needed.   Take care.  Glad to see you.  Thank you for your effort.

## 2017-11-08 NOTE — Progress Notes (Signed)
Diabetes:  Using medications without difficulties:yes, taking 4 metformin a day.   Hypoglycemic episodes:no sx but he eats often Hyperglycemic episodes: no sx Feet problems:no Blood Sugars averaging:not checked.  eye exam within last year: due,d/w pt.   A1c down to 7 and that may lag his progress.  D/w pt.   He has been using nutrisystem, eating more often but less per each episode.   D/w pt about diet and exercise.   Intentional weight loss noted.    H/o CAD so it makes sense to continue statin and ASA.    He is getting occ lightheaded, with standing suddenly.  D/w pt.    Meds, vitals, and allergies reviewed.  ROS: Per HPI unless specifically indicated in ROS section   GEN: nad, alert and oriented HEENT: mucous membranes moist NECK: supple w/o LA CV: rrr. PULM: ctab, no inc wob ABD: soft, +bs EXT: no edema

## 2017-11-09 NOTE — Assessment & Plan Note (Signed)
A1c down to 7 and that may lag his progress.  D/w pt.   He has been using nutrisystem, eating more often but less per each episode.   D/w pt about diet and exercise.   Intentional weight loss noted.   He'll call about an eye exam when possible.   Recheck in about 6 months at a physical.  Stop the lisinopril in the meantime given weight loss and low BP.  If any episodes with lower sugars then cut back on the metformin.  Update me as needed.  He agrees.

## 2018-05-16 ENCOUNTER — Other Ambulatory Visit: Payer: Self-pay | Admitting: Family Medicine

## 2018-05-16 DIAGNOSIS — E119 Type 2 diabetes mellitus without complications: Secondary | ICD-10-CM

## 2018-05-17 ENCOUNTER — Other Ambulatory Visit (INDEPENDENT_AMBULATORY_CARE_PROVIDER_SITE_OTHER): Payer: BLUE CROSS/BLUE SHIELD

## 2018-05-17 DIAGNOSIS — E119 Type 2 diabetes mellitus without complications: Secondary | ICD-10-CM | POA: Diagnosis not present

## 2018-05-17 LAB — COMPREHENSIVE METABOLIC PANEL
ALK PHOS: 49 U/L (ref 39–117)
ALT: 27 U/L (ref 0–53)
AST: 14 U/L (ref 0–37)
Albumin: 4.3 g/dL (ref 3.5–5.2)
BUN: 13 mg/dL (ref 6–23)
CHLORIDE: 105 meq/L (ref 96–112)
CO2: 30 meq/L (ref 19–32)
Calcium: 9.3 mg/dL (ref 8.4–10.5)
Creatinine, Ser: 0.87 mg/dL (ref 0.40–1.50)
GFR: 93.57 mL/min (ref >60.00)
GLUCOSE: 169 mg/dL — AB (ref 70–99)
POTASSIUM: 4.6 meq/L (ref 3.5–5.1)
SODIUM: 139 meq/L (ref 135–145)
TOTAL PROTEIN: 7 g/dL (ref 6.0–8.3)
Total Bilirubin: 0.5 mg/dL (ref 0.2–1.2)

## 2018-05-17 LAB — LIPID PANEL
Cholesterol: 118 mg/dL (ref 0–200)
HDL: 55.9 mg/dL (ref >39.00)
LDL Cholesterol: 45 mg/dL (ref 0–99)
NONHDL: 62.24
Total CHOL/HDL Ratio: 2
Triglycerides: 84 mg/dL (ref 0.0–149.0)
VLDL: 16.8 mg/dL (ref 0.0–40.0)

## 2018-05-17 LAB — MICROALBUMIN / CREATININE URINE RATIO
Creatinine,U: 139.5 mg/dL
Microalb Creat Ratio: 0.5 mg/g (ref 0.0–30.0)
Microalb, Ur: <0.7 mg/dL (ref 0.0–1.9)

## 2018-05-17 LAB — HEMOGLOBIN A1C: HEMOGLOBIN A1C: 7.1 % — AB (ref 4.6–6.5)

## 2018-05-24 ENCOUNTER — Telehealth: Payer: Self-pay | Admitting: *Deleted

## 2018-05-24 ENCOUNTER — Encounter: Payer: Self-pay | Admitting: Family Medicine

## 2018-05-24 ENCOUNTER — Ambulatory Visit (INDEPENDENT_AMBULATORY_CARE_PROVIDER_SITE_OTHER): Payer: BLUE CROSS/BLUE SHIELD | Admitting: Family Medicine

## 2018-05-24 VITALS — BP 116/70 | HR 71 | Temp 98.5°F | Ht 74.0 in | Wt 225.2 lb

## 2018-05-24 DIAGNOSIS — Z0000 Encounter for general adult medical examination without abnormal findings: Secondary | ICD-10-CM | POA: Diagnosis not present

## 2018-05-24 DIAGNOSIS — I25118 Atherosclerotic heart disease of native coronary artery with other forms of angina pectoris: Secondary | ICD-10-CM

## 2018-05-24 DIAGNOSIS — G4730 Sleep apnea, unspecified: Secondary | ICD-10-CM

## 2018-05-24 DIAGNOSIS — E119 Type 2 diabetes mellitus without complications: Secondary | ICD-10-CM

## 2018-05-24 DIAGNOSIS — Z7189 Other specified counseling: Secondary | ICD-10-CM

## 2018-05-24 MED ORDER — ROSUVASTATIN CALCIUM 20 MG PO TABS: 20.0000 mg | ORAL_TABLET | Freq: Every day | ORAL | 3 refills | Status: DC

## 2018-05-24 MED ORDER — METOPROLOL TARTRATE 25 MG PO TABS: ORAL_TABLET | ORAL | 3 refills | Status: DC

## 2018-05-24 MED ORDER — METFORMIN HCL ER (MOD) 1000 MG PO TB24: 2000.0000 mg | ORAL_TABLET | Freq: Every day | ORAL | 3 refills | Status: DC

## 2018-05-24 NOTE — Assessment & Plan Note (Signed)
Tetanus 2016 Flu shot to be done at work Shingles shot done prev PNA vaccine to be done out of clinic.  .  D/w patient Garrett Woodard:[id number] for colon cancer screening, including IFOB vs. colonoscopy. Risks and benefits of both were discussed and patient voiced understanding. Pt elects for: cologuard.   Diet and exercise d/w pt.  Prostate cancer screening and PSA options (with potential risks and benefits of testing vs not testing) were discussed along with recent recs/guidelines. He declined testing PSA at this point. no LUTS Living will d/w pt. Would have wife designated if incapacitated.  HIV and HCV prev done.  He has <30 pack year hx of smoking, so he didn't qualify for lung cancer screening threshold.  D/w pt.

## 2018-05-24 NOTE — Telephone Encounter (Signed)
PA submitted thru CMM for Metformin 1000 mg 24 hour tablet (Glumetza), awaiting response.

## 2018-05-24 NOTE — Assessment & Plan Note (Signed)
No CP, SOB, BLE edema.  Still on aspirin and statin.  No ADE on statin.  Diet and exercise d/w pt, ecnouraged both.  Labs d/w pt.  Continue current meds.  He agrees.

## 2018-05-24 NOTE — Progress Notes (Signed)
CPE- See plan.  Routine anticipatory guidance given to patient.  See health maintenance.  The possibility exists that previously documented standard health maintenance information may have been brought forward from a previous encounter into this note.  If needed, that same information has been updated to reflect the current situation based on today's encounter.    Tetanus 2016 Flu shot to be done at work Shingles shot done prev PNA vaccine to be done out of clinic.  .  D/w patient [id number] for colon cancer screening, including IFOB vs. colonoscopy. Risks and benefits of both were discussed and patient voiced understanding. Pt elects for: cologuard.   Diet and exercise d/w pt.  Prostate cancer screening and PSA options (with potential risks and benefits of testing vs not testing) were discussed along with recent recs/guidelines. He declined testing PSA at this point. no LUTS Living will d/w pt. Would have wife designated if incapacitated.  HIV and HCV prev done.  He has <30 pack year hx of smoking, so he didn't qualify for lung cancer screening threshold.  D/w pt.   See avs.   He is off CPAP and not snoring. Had improved with weight loss prev.   Diabetes:  Using medications without difficulties:yes but he was taking 4 of the 500mg  IR metformin in the AM  D/w pt about ER dosing.   Hypoglycemic episodes: no sx, encouraged avoiding prolonged fasting Hyperglycemic episodes: no sx Feet problems: no Blood Sugars averaging: not checked eye exam within last year: due, d/w pt.  A1c stable.   D/w pt about lower/healthy carb diet.    CAD.  No CP, SOB, BLE edema.  Still on aspirin and statin.  No ADE on statin.  Diet and exercise d/w pt, ecnouraged both.   He is playing guitar and he is having fun with that.    PMH and SH reviewed  Meds, vitals, and allergies reviewed.   ROS: Per HPI.  Unless specifically indicated otherwise in HPI, the patient denies:  General: fever. Eyes: acute  vision changes ENT: sore throat Cardiovascular: chest pain Respiratory: SOB GI: vomiting GU: dysuria Musculoskeletal: acute back pain Derm: acute rash Neuro: acute motor dysfunction Psych: worsening mood Endocrine: polydipsia Heme: bleeding Allergy: hayfever  GEN: nad, alert and oriented HEENT: mucous membranes moist NECK: supple w/o LA CV: rrr. PULM: ctab, no inc wob ABD: soft, +bs EXT: no edema SKIN: no acute rash  Diabetic foot exam: Normal inspection No skin breakdown No calluses  Normal DP pulses Normal sensation to light touch and monofilament Nails normal

## 2018-05-24 NOTE — Assessment & Plan Note (Signed)
Living will d/w pt. Would have wife designated if incapacitated.  

## 2018-05-24 NOTE — Assessment & Plan Note (Signed)
A1c reasonable, d/w pt about diet and exercise.  Change to ER metformin.  Update me as needed.  Normal foot exam.  Recheck in 6 months, sooner if needed.  He agrees.

## 2018-05-24 NOTE — Assessment & Plan Note (Signed)
Hx of, he is off CPAP and not snoring. Had improved with weight loss prev.

## 2018-05-24 NOTE — Patient Instructions (Addendum)
Ask for the pneumonia 23 vaccine.  I would get a flu shot each fall.   Check to make sure cologuard is covered.   Recheck A1c in about 6 months at a visit.   Take care.  Glad to see you.  Call cardiology if they don't call you first.

## 2018-05-27 NOTE — Telephone Encounter (Signed)
Approved thru CMM, awaiting faxed response to scan.

## 2018-05-31 DIAGNOSIS — Z1211 Encounter for screening for malignant neoplasm of colon: Secondary | ICD-10-CM | POA: Diagnosis not present

## 2018-05-31 DIAGNOSIS — Z1212 Encounter for screening for malignant neoplasm of rectum: Secondary | ICD-10-CM | POA: Diagnosis not present

## 2018-05-31 LAB — COLOGUARD: COLOGUARD: NEGATIVE

## 2018-06-13 ENCOUNTER — Encounter: Payer: Self-pay | Admitting: Family Medicine

## 2018-07-12 ENCOUNTER — Telehealth: Payer: Self-pay

## 2018-07-12 NOTE — Telephone Encounter (Signed)
Pt said doing "health thing" at work and the nurse at work cannot find lab results from 05/17/18. I advised I can mail a copy to pt or pt can pick up copy of lab results at [facility] front desk. Pt said would see if nurse can find first. Nothing further at this time.

## 2018-08-06 ENCOUNTER — Other Ambulatory Visit: Payer: Self-pay | Admitting: Family Medicine

## 2018-09-21 ENCOUNTER — Other Ambulatory Visit: Payer: Self-pay | Admitting: Family Medicine

## 2018-11-23 ENCOUNTER — Ambulatory Visit: Payer: BLUE CROSS/BLUE SHIELD | Admitting: Family Medicine

## 2019-03-23 ENCOUNTER — Other Ambulatory Visit: Payer: Self-pay | Admitting: Family Medicine

## 2019-03-23 NOTE — Telephone Encounter (Signed)
Appointment for physical and labs prior scheduled for 10/22 and 05/29/2019

## 2019-05-24 ENCOUNTER — Other Ambulatory Visit (INDEPENDENT_AMBULATORY_CARE_PROVIDER_SITE_OTHER): Payer: BC Managed Care – PPO

## 2019-05-24 ENCOUNTER — Other Ambulatory Visit: Payer: Self-pay

## 2019-05-24 ENCOUNTER — Other Ambulatory Visit: Payer: Self-pay | Admitting: Family Medicine

## 2019-05-24 DIAGNOSIS — E119 Type 2 diabetes mellitus without complications: Secondary | ICD-10-CM

## 2019-05-24 LAB — COMPREHENSIVE METABOLIC PANEL
ALT: 34 U/L (ref 0–53)
AST: 18 U/L (ref 0–37)
Albumin: 4.2 g/dL (ref 3.5–5.2)
Alkaline Phosphatase: 54 U/L (ref 39–117)
BUN: 13 mg/dL (ref 6–23)
CO2: 28 mEq/L (ref 19–32)
Calcium: 9.2 mg/dL (ref 8.4–10.5)
Chloride: 103 mEq/L (ref 96–112)
Creatinine, Ser: 0.96 mg/dL (ref 0.40–1.50)
GFR: 78.33 mL/min (ref >60.00)
Glucose, Bld: 170 mg/dL — ABNORMAL HIGH (ref 70–99)
Potassium: 4.6 mEq/L (ref 3.5–5.1)
Sodium: 137 mEq/L (ref 135–145)
Total Bilirubin: 0.6 mg/dL (ref 0.2–1.2)
Total Protein: 6.8 g/dL (ref 6.0–8.3)

## 2019-05-24 LAB — MICROALBUMIN / CREATININE URINE RATIO
Creatinine,U: 106.2 mg/dL
Microalb Creat Ratio: 0.7 mg/g (ref 0.0–30.0)
Microalb, Ur: <0.7 mg/dL (ref 0.0–1.9)

## 2019-05-24 LAB — LIPID PANEL
Cholesterol: 120 mg/dL (ref 0–200)
HDL: 52.8 mg/dL (ref >39.00)
LDL Cholesterol: 43 mg/dL (ref 0–99)
NonHDL: 67.24
Total CHOL/HDL Ratio: 2
Triglycerides: 122 mg/dL (ref 0.0–149.0)
VLDL: 24.4 mg/dL (ref 0.0–40.0)

## 2019-05-24 LAB — HEMOGLOBIN A1C: Hgb A1c MFr Bld: 7.9 % — ABNORMAL HIGH (ref 4.6–6.5)

## 2019-05-24 NOTE — Addendum Note (Signed)
Addended by: Cloyd Stagers on: 05/24/2019 08:43 AM   Modules accepted: Orders

## 2019-05-25 ENCOUNTER — Other Ambulatory Visit: Payer: BLUE CROSS/BLUE SHIELD

## 2019-05-29 ENCOUNTER — Other Ambulatory Visit: Payer: Self-pay

## 2019-05-29 ENCOUNTER — Encounter: Payer: Self-pay | Admitting: Family Medicine

## 2019-05-29 ENCOUNTER — Ambulatory Visit (INDEPENDENT_AMBULATORY_CARE_PROVIDER_SITE_OTHER): Payer: BC Managed Care – PPO | Admitting: Family Medicine

## 2019-05-29 VITALS — BP 140/80 | HR 84 | Temp 97.7°F | Ht 74.0 in | Wt 236.3 lb

## 2019-05-29 DIAGNOSIS — Z7189 Other specified counseling: Secondary | ICD-10-CM

## 2019-05-29 DIAGNOSIS — Z0000 Encounter for general adult medical examination without abnormal findings: Secondary | ICD-10-CM

## 2019-05-29 DIAGNOSIS — Z23 Encounter for immunization: Secondary | ICD-10-CM

## 2019-05-29 DIAGNOSIS — E7800 Pure hypercholesterolemia, unspecified: Secondary | ICD-10-CM

## 2019-05-29 DIAGNOSIS — L989 Disorder of the skin and subcutaneous tissue, unspecified: Secondary | ICD-10-CM

## 2019-05-29 DIAGNOSIS — E119 Type 2 diabetes mellitus without complications: Secondary | ICD-10-CM

## 2019-05-29 DIAGNOSIS — I10 Essential (primary) hypertension: Secondary | ICD-10-CM

## 2019-05-29 NOTE — Patient Instructions (Addendum)
Check with your insurance to see if they will cover the shingrix shot. Call about an eye exam and cardiac appointment. when possible.  We'll call about the skin clinic.  Pneumonia shot today.  Take care.  Glad to see you.

## 2019-05-29 NOTE — Progress Notes (Signed)
CPE- See plan.  Routine anticipatory guidance given to patient.  See health maintenance.  The possibility exists that previously documented standard health maintenance information may have been brought forward from a previous encounter into this note.  If needed, that same information has been updated to reflect the current situation based on today's encounter.    Tetanus 2016 Flu shot to be done at work Shingles d/w pt.  PNA vaccine 2020.  Cologuard 2019 Diet and exercise d/w pt.  Prostate cancer screening and PSA options (with potential risks and benefits of testing vs not testing) were discussed along with recent recs/guidelines. He declined testing PSA at this point. no LUTS Living will d/w pt. Would have wife designated if patient were incapacitated.  HIV and HCV prev done.  He has <30 pack year hx of smoking, so he didn't qualify for lung cancer screening threshold. D/w pt.  AAA screening.  D/w pt, he'll consider.    He wanted to see dermatology . Small crusted lesion on L side of scalp.  Referred.  He is retiring at the end of the year.  D/w pt.    Diabetes:  Using medications without difficulties: yes Hypoglycemic episodes:no sx Hyperglycemic episodes:no sx Feet problems:no Blood Sugars averaging: not check ed.  eye exam within last year: due, d/w pt.  A1c d/w pt.   Hypertension:    Using medication without problems or lightheadedness: yes Chest pain with exertion:no Edema:no Short of breath:no Labs d/w pt.   Elevated Cholesterol: Using medications without problems:yes Muscle aches: no from statin, sore from exercise at baseline.   Diet compliance:yes Exercise:no Labs d/w pt.   PMH and SH reviewed.   Vital signs, Meds and allergies reviewed.  ROS: Per HPI unless specifically indicated in ROS section   GEN: nad, alert and oriented HEENT: ncat NECK: supple w/o LA CV: rrr.  no murmur PULM: ctab, no inc wob ABD: soft, +bs EXT: no edema SKIN: no acute  rash  Diabetic foot exam: Normal inspection No skin breakdown No calluses  Normal DP pulses Normal sensation to light tough and monofilament Nails normal  Health Maintenance  Topic Date Due  . OPHTHALMOLOGY EXAM  04/02/1961  . INFLUENZA VACCINE  11/01/2019 (Originally 03/04/2019)  . HEMOGLOBIN A1C  11/22/2019  . URINE MICROALBUMIN  05/23/2020  . FOOT EXAM  05/28/2020  . PNA vac Low Risk Adult (2 of 2 - PCV13) 05/28/2020  . Fecal DNA (Cologuard)  05/31/2021  . TETANUS/TDAP  03/25/2025  . Hepatitis C Screening  Completed

## 2019-06-01 DIAGNOSIS — L989 Disorder of the skin and subcutaneous tissue, unspecified: Secondary | ICD-10-CM | POA: Insufficient documentation

## 2019-06-01 NOTE — Assessment & Plan Note (Signed)
This could be a small irritated seborrheic keratosis versus a small BCC versus SCC.  Reasonable to see dermatology.  Discussed with patient.  Referred.

## 2019-06-01 NOTE — Assessment & Plan Note (Signed)
Continue work on diet and exercise.  No change in meds at this point.  Recheck in 6 months.  Hopefully he can improve his A1c will continue to work on diet and exercise.

## 2019-06-01 NOTE — Assessment & Plan Note (Signed)
Living will d/w pt. Would have wife designated if patient were incapacitated.  

## 2019-06-01 NOTE — Assessment & Plan Note (Signed)
Tetanus 2016 Flu shot to be done at work Shingles d/w pt.  PNA vaccine 2020.  Cologuard 2019 Diet and exercise d/w pt.  Prostate cancer screening and PSA options (with potential risks and benefits of testing vs not testing) were discussed along with recent recs/guidelines. He declined testing PSA at this point. no LUTS Living will d/w pt. Would have wife designated if patient were incapacitated.  HIV and HCV prev done.  He has <30 pack year hx of smoking, so he didn't qualify for lung cancer screening threshold. D/w pt.  AAA screening.  D/w pt, he'll consider.

## 2019-06-01 NOTE — Assessment & Plan Note (Signed)
No change in meds at this point.  Continue work on diet and exercise.  He agrees.

## 2019-06-21 DIAGNOSIS — L570 Actinic keratosis: Secondary | ICD-10-CM | POA: Diagnosis not present

## 2019-06-21 DIAGNOSIS — D225 Melanocytic nevi of trunk: Secondary | ICD-10-CM | POA: Diagnosis not present

## 2019-06-21 DIAGNOSIS — X32XXXA Exposure to sunlight, initial encounter: Secondary | ICD-10-CM | POA: Diagnosis not present

## 2019-06-21 DIAGNOSIS — L821 Other seborrheic keratosis: Secondary | ICD-10-CM | POA: Diagnosis not present

## 2019-08-16 ENCOUNTER — Telehealth: Payer: Self-pay | Admitting: Family Medicine

## 2019-08-16 DIAGNOSIS — E119 Type 2 diabetes mellitus without complications: Secondary | ICD-10-CM

## 2019-08-16 DIAGNOSIS — E7800 Pure hypercholesterolemia, unspecified: Secondary | ICD-10-CM

## 2019-08-16 DIAGNOSIS — I10 Essential (primary) hypertension: Secondary | ICD-10-CM

## 2019-08-16 MED ORDER — ROSUVASTATIN CALCIUM 20 MG PO TABS: 20.0000 mg | ORAL_TABLET | Freq: Every day | ORAL | 3 refills | Status: DC

## 2019-08-16 MED ORDER — METFORMIN HCL 500 MG PO TABS: ORAL_TABLET | ORAL | 3 refills | Status: DC

## 2019-08-16 MED ORDER — METOPROLOL TARTRATE 25 MG PO TABS: ORAL_TABLET | ORAL | 3 refills | Status: DC

## 2019-08-16 NOTE — Telephone Encounter (Signed)
Refills sent to [facility] as requested.

## 2019-08-16 NOTE — Telephone Encounter (Signed)
Patient called and said he's now on Medicare.  Patient needs his medications sent to [facility].  Patient said he needs all of his medications refilled.  When I asked patient for the list of medications, he said we had the medication list.

## 2019-12-21 ENCOUNTER — Telehealth: Payer: Self-pay

## 2019-12-21 NOTE — Telephone Encounter (Signed)
Called patient and got him scheduled for CPE and labs. 

## 2019-12-21 NOTE — Telephone Encounter (Signed)
[name] - Client Nonclinical Telephone Record AccessNurse Client [name] - Client Client Site [facility] Physician Renford Dills - MD Contact Type Call Who Is Calling Patient / Member / Family / Caregiver Caller Name Traeden Worsham Caller Phone Number [phone] Patient Name Garrett Woodard Patient DOB [birth year] Call Type Message Only Information Provided Reason for Call Request to Schedule Office Appointment Initial Comment Caller stated that they would like to schedule a medical appointment. Additional Comment Caller declined triage. Caller was provided the office hours. Disp. Time Disposition Final User 12/20/2019 5:18:04 PM General Information Provided Yes Ishimwe, Giselle Call Closed By: Morrison Old Transaction Date/Time: 12/20/2019 5:15:02 PM (ET)

## 2019-12-22 NOTE — Telephone Encounter (Signed)
Noted  

## 2020-02-21 ENCOUNTER — Other Ambulatory Visit: Payer: Self-pay | Admitting: Family Medicine

## 2020-02-21 DIAGNOSIS — E119 Type 2 diabetes mellitus without complications: Secondary | ICD-10-CM

## 2020-03-05 ENCOUNTER — Other Ambulatory Visit: Payer: Self-pay

## 2020-03-05 ENCOUNTER — Other Ambulatory Visit (INDEPENDENT_AMBULATORY_CARE_PROVIDER_SITE_OTHER): Payer: Medicare HMO

## 2020-03-05 DIAGNOSIS — E119 Type 2 diabetes mellitus without complications: Secondary | ICD-10-CM | POA: Diagnosis not present

## 2020-03-05 LAB — COMPREHENSIVE METABOLIC PANEL
ALT: 34 U/L (ref 0–53)
AST: 20 U/L (ref 0–37)
Albumin: 4.5 g/dL (ref 3.5–5.2)
Alkaline Phosphatase: 68 U/L (ref 39–117)
BUN: 16 mg/dL (ref 6–23)
CO2: 28 mEq/L (ref 19–32)
Calcium: 9.8 mg/dL (ref 8.4–10.5)
Chloride: 102 mEq/L (ref 96–112)
Creatinine, Ser: 1.06 mg/dL (ref 0.40–1.50)
GFR: 69.7 mL/min (ref >60.00)
Glucose, Bld: 256 mg/dL — ABNORMAL HIGH (ref 70–99)
Potassium: 4.5 mEq/L (ref 3.5–5.1)
Sodium: 137 mEq/L (ref 135–145)
Total Bilirubin: 0.8 mg/dL (ref 0.2–1.2)
Total Protein: 7.1 g/dL (ref 6.0–8.3)

## 2020-03-05 LAB — LIPID PANEL
Cholesterol: 118 mg/dL (ref 0–200)
HDL: 49.8 mg/dL (ref >39.00)
LDL Cholesterol: 40 mg/dL (ref 0–99)
NonHDL: 67.7
Total CHOL/HDL Ratio: 2
Triglycerides: 140 mg/dL (ref 0.0–149.0)
VLDL: 28 mg/dL (ref 0.0–40.0)

## 2020-03-05 LAB — MICROALBUMIN / CREATININE URINE RATIO
Creatinine,U: 202.5 mg/dL
Microalb Creat Ratio: 0.7 mg/g (ref 0.0–30.0)
Microalb, Ur: 1.4 mg/dL (ref 0.0–1.9)

## 2020-03-05 LAB — HEMOGLOBIN A1C: Hgb A1c MFr Bld: 9 % — ABNORMAL HIGH (ref 4.6–6.5)

## 2020-03-05 NOTE — Addendum Note (Signed)
Addended by: Ellamae Sia on: 03/05/2020 08:24 AM   Modules accepted: Orders

## 2020-03-12 ENCOUNTER — Encounter: Payer: Self-pay | Admitting: Family Medicine

## 2020-03-12 ENCOUNTER — Ambulatory Visit (INDEPENDENT_AMBULATORY_CARE_PROVIDER_SITE_OTHER): Payer: Medicare HMO | Admitting: Family Medicine

## 2020-03-12 ENCOUNTER — Other Ambulatory Visit: Payer: Self-pay

## 2020-03-12 VITALS — BP 130/74 | HR 106 | Temp 97.7°F | Ht 72.05 in | Wt 223.6 lb

## 2020-03-12 DIAGNOSIS — E119 Type 2 diabetes mellitus without complications: Secondary | ICD-10-CM

## 2020-03-12 DIAGNOSIS — E7800 Pure hypercholesterolemia, unspecified: Secondary | ICD-10-CM | POA: Diagnosis not present

## 2020-03-12 DIAGNOSIS — Z0000 Encounter for general adult medical examination without abnormal findings: Secondary | ICD-10-CM | POA: Diagnosis not present

## 2020-03-12 DIAGNOSIS — Z7189 Other specified counseling: Secondary | ICD-10-CM

## 2020-03-12 NOTE — Progress Notes (Signed)
This visit occurred during the SARS-CoV-2 public health emergency.  Safety protocols were in place, including screening questions prior to the visit, additional usage of staff PPE, and extensive cleaning of exam room while observing appropriate contact time as indicated for disinfecting solutions.  I have personally reviewed the Medicare Annual Wellness questionnaire and have noted 1. The patient's medical and social history 2. Their use of alcohol, tobacco or illicit drugs 3. Their current medications and supplements 4. The patient's functional ability including ADL's, fall risks, home safety risks and hearing or visual             impairment. 5. Diet and physical activities 6. Evidence for depression or mood disorders  The patients weight, height, BMI have been recorded in the chart and visual acuity is per eye clinic.  I have made referrals, counseling and provided education to the patient based review of the above and I have provided the pt with a written personalized care plan for preventive services.  Provider list updated- see scanned forms.  Routine anticipatory guidance given to patient.  See health maintenance. The possibility exists that previously documented standard health maintenance information may have been brought forward from a previous encounter into this note.  If needed, that same information has been updated to reflect the current situation based on today's encounter.    Flu encouraged. Shingles discussed with patient. PNA-23 done 2020 Tetanus 2016 Covid vaccine previously done per patient report. Colon cancer screening up-to-date with Cologuard 2019 Prostate cancer screening declined.  Discussed with patient about pros and cons of prostate cancer screening. Advance directive discussed with patient.  Wife designated if patient were incapacitated. Cognitive function addressed- see scanned forms- and if abnormal then additional documentation follows.  AAA screening.  D/w  pt, he'll consider.    Hearing intact on whisper testing.   EKG in chart.    His father died this year, condolences offered.  Pt retired in the meantime.  D/w pt.    No CP.  Not SOB.  Playing tennis and active.    Diabetes:  Using medications without difficulties: yes, other than mild GI sx Hypoglycemic episodes: rare, cautions d/w pt.  If prolonged fasting. Hyperglycemic episodes: no Feet problems:no Blood Sugars averaging: not checked.   eye exam within last year: due, d/w pt.    Elevated Cholesterol: Using medications without problems: yes Muscle aches: no Diet compliance: yes Exercise: yes  Labs d/w pt.  Still on crestor.    PMH and SH reviewed  Meds, vitals, and allergies reviewed.   ROS: Per HPI.  Unless specifically indicated otherwise in HPI, the patient denies:  General: fever. Eyes: acute vision changes ENT: sore throat Cardiovascular: chest pain Respiratory: SOB GI: vomiting GU: dysuria Musculoskeletal: acute back pain Derm: acute rash Neuro: acute motor dysfunction Psych: worsening mood Endocrine: polydipsia Heme: bleeding Allergy: hayfever  GEN: nad, alert and oriented HEENT: ncat NECK: supple w/o LA CV: rrr. PULM: ctab, no inc wob ABD: soft, +bs EXT: no edema SKIN: no acute rash  Diabetic foot exam: Normal inspection No skin breakdown No calluses  Normal DP pulses Normal sensation to light touch and monofilament Nails normal

## 2020-03-12 NOTE — Patient Instructions (Addendum)
Check with your insurance to see if they will cover the shingrix shot. Recheck in about 3 months.    Keep working on diet and exercise in the meantime.   The only lab you need to have done for your next diabetic visit is an A1c.  We can do this with a fingerstick test at the office visit.  You do not need a lab visit ahead of time for this.  It does not matter if you are fasting when the lab is done.    Take care.  Glad to see you.

## 2020-03-18 NOTE — Assessment & Plan Note (Signed)
Labs discussed with patient.  Continue Crestor.  Continue work on diet and exercise.

## 2020-03-18 NOTE — Assessment & Plan Note (Signed)
Flu encouraged. Shingles discussed with patient. PNA-23 done 2020 Tetanus 2016 Covid vaccine previously done per patient report. Colon cancer screening up-to-date with Cologuard 2019 Prostate cancer screening declined.  Discussed with patient about pros and cons of prostate cancer screening. Advance directive discussed with patient.  Wife designated if patient were incapacitated. Cognitive function addressed- see scanned forms- and if abnormal then additional documentation follows.  AAA screening.  D/w pt, he'll consider.    Hearing intact on whisper testing.   EKG in chart.

## 2020-03-18 NOTE — Assessment & Plan Note (Signed)
Advance directive discussed with patient.  Wife designated if patient were incapacitated. 

## 2020-03-18 NOTE — Assessment & Plan Note (Signed)
A1c up.  No change in meds at this point.  He is going to work on diet and exercise and recheck in 3 months and we will go from there.  See after visit summary.  He agrees.

## 2020-04-15 NOTE — Progress Notes (Signed)
Cardiology Office Note  Date:  04/16/2020   ID:  Garrett Woodard, Garrett Woodard 04/14/1951, MRN [id number]  PCP:  Garrett Ghent, MD   Chief Complaint  Patient presents with   OTHER    OD 12 month LS 05/2017 no complaints today. Meds reviewed verbally with pt.    HPI:  Garrett Woodard is a 69 year old gentleman with  smoking history, stopped in 2005,  coronary artery disease with CABG in 2005 , s/p ASD ,  Previous anginal sx with nausea, ache in jaw, tingling in arm obstructive sleep apnea, on CPAP,  hyperlipidemia,  diabetes type 2 with hemoglobin A1c greater than 8,  who presents for follow-up of his coronary artery disease Hobbies include fishing and musician   Retired last year Going to [employer], fishing  Father died 2 months ago  HBA1C 7.0, ran up to 9.0 Was drinking milkshakes  No chest pain, no SOB   Lab Results  Component Value Date   CHOL 118 03/05/2020   HDL 49.80 03/05/2020   LDLCALC 40 03/05/2020   TRIG 140.0 03/05/2020    EKG personally reviewed by myself on todays visit Shows nsr rate 89 bpm no ST or T wave changes  Other past medical history No recent stress test available Prior cardiac catheterization in 2005 not available. This was done at [facility]   PMH:   has a past medical history of Allergy, Asthma, Coronary artery disease (10/04/2003), Diabetes mellitus, Hyperlipidemia, and Hypertension.  PSH:    Past Surgical History:  Procedure Laterality Date   CORONARY ANGIOPLASTY  10/04/2003   cath.  at [facility]): EF70%, 3 vessell dz. Dr. Gwenlyn Saran wnl 11/16/2003// Exercise Cardiolite wnl EF 60% 12/03/2003   CORONARY ARTERY BYPASS GRAFT  10/2003   CABG atrial septal defect repair    Current Outpatient Medications  Medication Sig Dispense Refill   albuterol (PROAIR HFA) 108 (90 Base) MCG/ACT inhaler Inhale 1 puff into the lungs every 6 (six) hours as needed for wheezing. 18 g 1   aspirin 81 MG EC tablet Take 81 mg by mouth daily.        metFORMIN (GLUCOPHAGE) 500 MG tablet Take two tablets by mouth twice a day. 360 tablet 3   metoprolol tartrate (LOPRESSOR) 25 MG tablet TAKE 1/2 TABLET BY MOUTH EVERY DAY 45 tablet 3   rosuvastatin (CRESTOR) 20 MG tablet Take 1 tablet (20 mg total) by mouth daily. 90 tablet 3   phentermine (ADIPEX-P) 37.5 MG tablet Take 37.5 mg by mouth daily.     No current facility-administered medications for this visit.     Allergies:   Patient has no known allergies.   Social History:  The patient  reports that he quit smoking about 17 years ago. His smoking use included cigarettes. He has never used smokeless tobacco. He reports current alcohol use. He reports that he does not use drugs.   Family History:   family history includes Alcohol abuse in his sister; Cancer in his father; Depression in his mother; Heart disease (age of onset: 72) in his father; Hypertension in his mother; Stroke in his mother.    Review of Systems: Review of Systems  Constitutional: Negative.   Respiratory: Negative.   Cardiovascular: Negative.   Gastrointestinal: Negative.   Musculoskeletal: Negative.   Neurological: Negative.   Psychiatric/Behavioral: Negative.   All other systems reviewed and are negative. No change in his review of systems   PHYSICAL EXAM: VS:  BP 140/90 (BP Location: Left Arm, Patient  Position: Sitting, Cuff Size: Normal)    Pulse 89    Ht 6\' 2"  (1.88 m)    Wt 219 lb 6 oz (99.5 kg)    SpO2 98%    BMI 28.17 kg/m  , BMI Body mass index is 28.17 kg/m. Constitutional:  oriented to person, place, and time. No distress.  HENT:  Head: Grossly normal Eyes:  no discharge. No scleral icterus.  Neck: No JVD, no carotid bruits  Cardiovascular: Regular rate and rhythm, no murmurs appreciated Pulmonary/Chest: Clear to auscultation bilaterally, no wheezes or rails Abdominal: Soft.  no distension.  no tenderness.  Musculoskeletal: Normal range of motion Neurological:  normal muscle tone. Coordination  normal. No atrophy Skin: Skin warm and dry Psychiatric: normal affect, pleasant   Recent Labs: 03/05/2020: ALT 34; BUN 16; Creatinine, Ser 1.06; Potassium 4.5; Sodium 137    Lipid Panel Lab Results  Component Value Date   CHOL 118 03/05/2020   HDL 49.80 03/05/2020   LDLCALC 40 03/05/2020   TRIG 140.0 03/05/2020      Wt Readings from Last 3 Encounters:  04/16/20 219 lb 6 oz (99.5 kg)  03/12/20 223 lb 9 oz (101.4 kg)  05/29/19 236 lb 5 oz (107.2 kg)       ASSESSMENT AND PLAN:  Essential hypertension, benign Blood pressure is well controlled on today's visit. No changes made to the medications.  Mixed hyperlipidemia Numbers at goal, no medication changes made  Coronary artery disease of native artery of native heart with stable angina pectoris (HCC) Currently with no symptoms of angina. No further workup at this time. Continue current medication regimen. Stressed importance of aggressive diabetes control  Uncontrolled type 2 diabetes mellitus with other circulatory complication, without long-term current use of insulin (HCC) Hemoglobin A1c 9.0 Milkshakes, has now changed diet  Sleep apnea, unspecified type Keep weight low  S/P CABG (coronary artery bypass graft)  previous surgery 2005 denies angina     Total encounter time more than 25 minutes  Greater than 50% was spent in counseling and coordination of care with the patient    No orders of the defined types were placed in this encounter.    Signed, Esmond Plants, M.D., Ph.D. 04/16/2020  [place], [place]

## 2020-04-16 ENCOUNTER — Ambulatory Visit: Payer: Medicare HMO | Admitting: Cardiovascular Disease

## 2020-04-16 ENCOUNTER — Other Ambulatory Visit: Payer: Self-pay

## 2020-04-16 ENCOUNTER — Encounter: Payer: Self-pay | Admitting: Cardiovascular Disease

## 2020-04-16 VITALS — BP 140/90 | HR 89 | Ht 74.0 in | Wt 219.4 lb

## 2020-04-16 DIAGNOSIS — I25118 Atherosclerotic heart disease of native coronary artery with other forms of angina pectoris: Secondary | ICD-10-CM | POA: Diagnosis not present

## 2020-04-16 DIAGNOSIS — I10 Essential (primary) hypertension: Secondary | ICD-10-CM

## 2020-04-16 DIAGNOSIS — E7800 Pure hypercholesterolemia, unspecified: Secondary | ICD-10-CM | POA: Diagnosis not present

## 2020-04-16 DIAGNOSIS — Z951 Presence of aortocoronary bypass graft: Secondary | ICD-10-CM | POA: Diagnosis not present

## 2020-04-16 DIAGNOSIS — E118 Type 2 diabetes mellitus with unspecified complications: Secondary | ICD-10-CM

## 2020-04-16 DIAGNOSIS — Z87891 Personal history of nicotine dependence: Secondary | ICD-10-CM

## 2020-04-16 NOTE — Patient Instructions (Signed)
Medication Instructions:  No changes  If you need a refill on your cardiac medications before your next appointment, please call your pharmacy.    Lab work: No new labs needed   If you have labs (blood work) drawn today and your tests are completely normal, you will receive your results only by: . MyChart Message (if you have MyChart) OR . A paper copy in the mail If you have any lab test that is abnormal or we need to change your treatment, we will call you to review the results.   Testing/Procedures: No new testing needed   Follow-Up: At [facility], you and your health needs are our priority.  As part of our continuing mission to provide you with exceptional heart care, we have created designated Provider Care Teams.  These Care Teams include your primary Cardiologist (physician) and Advanced Practice Providers (APPs -  Physician Assistants and Nurse Practitioners) who all work together to provide you with the care you need, when you need it.  . You will need a follow up appointment in 12 months  . Providers on your designated Care Team:   . Christopher Berge, NP . Ryan Dunn, PA-C . Jacquelyn Visser, PA-C  Any Other Special Instructions Will Be Listed Below (If Applicable).  COVID-19 Vaccine Information can be found at: [email or web address] For questions related to vaccine distribution or appointments, please email [email or web address] or call [phone].     

## 2020-05-23 ENCOUNTER — Other Ambulatory Visit: Payer: Self-pay | Admitting: Family Medicine

## 2020-05-23 DIAGNOSIS — E119 Type 2 diabetes mellitus without complications: Secondary | ICD-10-CM

## 2020-05-23 DIAGNOSIS — E7800 Pure hypercholesterolemia, unspecified: Secondary | ICD-10-CM

## 2020-05-23 DIAGNOSIS — I10 Essential (primary) hypertension: Secondary | ICD-10-CM

## 2020-05-27 ENCOUNTER — Other Ambulatory Visit: Payer: BC Managed Care – PPO

## 2020-05-31 ENCOUNTER — Encounter: Payer: BC Managed Care – PPO | Admitting: Family Medicine

## 2020-06-18 ENCOUNTER — Ambulatory Visit: Payer: Medicare HMO | Admitting: Family Medicine

## 2020-06-25 ENCOUNTER — Ambulatory Visit (INDEPENDENT_AMBULATORY_CARE_PROVIDER_SITE_OTHER): Payer: Medicare HMO | Admitting: Family Medicine

## 2020-06-25 ENCOUNTER — Encounter: Payer: Self-pay | Admitting: Family Medicine

## 2020-06-25 VITALS — BP 136/80 | HR 81 | Temp 97.8°F | Ht 74.0 in | Wt 213.6 lb

## 2020-06-25 DIAGNOSIS — M25569 Pain in unspecified knee: Secondary | ICD-10-CM | POA: Diagnosis not present

## 2020-06-25 DIAGNOSIS — E119 Type 2 diabetes mellitus without complications: Secondary | ICD-10-CM

## 2020-06-25 DIAGNOSIS — Z23 Encounter for immunization: Secondary | ICD-10-CM | POA: Diagnosis not present

## 2020-06-25 LAB — POCT GLYCOSYLATED HEMOGLOBIN (HGB A1C): Hemoglobin A1C: 8.1 % — AB (ref 4.0–5.6)

## 2020-06-25 NOTE — Patient Instructions (Addendum)
Try icing 5 minutes at a time.  Take care.  Glad to see you. Plan on recheck in about 3-4 months. A1c at the visit.  Thanks for your effort.

## 2020-06-25 NOTE — Progress Notes (Signed)
This visit occurred during the SARS-CoV-2 public health emergency.  Safety protocols were in place, including screening questions prior to the visit, additional usage of staff PPE, and extensive cleaning of exam room while observing appropriate contact time as indicated for disinfecting solutions.  Diabetes:  Using medications without difficulties: yes Hypoglycemic episodes: no Hyperglycemic episodes: no Feet problems: no Blood Sugars averaging: not checked at home.   eye exam within last year: d/w pt.   Working on diet and exercise.  Playing tennis.   A1c improved, d/w pt at [place].   Flu shot done today.  R knee pain started about 2-3 weeks ago.  After playing tennis.  No injury with exercise.  Clicking noted with ROM.  Using a knee brace in the meantime.  Not icing yet, d/w pt.    Phentermine per outside clinic.  I'll defer.  BP is reasonable.  He is soon to get off phentermine.    His stress level is sig better with retirement.  D/w pt.    Meds, vitals, and allergies reviewed.  ROS: Per HPI unless specifically indicated in ROS section   GEN: nad, alert and oriented HEENT: ncat NECK: supple w/o LA CV: rrr. PULM: ctab, no inc wob EXT: no edema SKIN: no acute rash Right knee with normal range of motion.  Not tender to palpation on the medial or lateral joint line.  Tibial tubercle not tender to palpation.  Patella and quad ligament not tender.  ACL intact.  No pain on varus or valgus stress.  No bruising.

## 2020-06-27 DIAGNOSIS — M25569 Pain in unspecified knee: Secondary | ICD-10-CM | POA: Insufficient documentation

## 2020-06-27 NOTE — Assessment & Plan Note (Signed)
A1c improved.  Continue Metformin.  Continue work on diet and exercise and recheck in a few months.  See after visit summary.

## 2020-06-27 NOTE — Assessment & Plan Note (Signed)
He could have had a soft tissue strain with exercise.  Benign knee exam today.  Discussed icing and using an over-the-counter knee sleeve.  He will update me as needed.

## 2020-08-29 DIAGNOSIS — H2513 Age-related nuclear cataract, bilateral: Secondary | ICD-10-CM | POA: Diagnosis not present

## 2020-08-29 DIAGNOSIS — H5203 Hypermetropia, bilateral: Secondary | ICD-10-CM | POA: Diagnosis not present

## 2020-08-29 DIAGNOSIS — H524 Presbyopia: Secondary | ICD-10-CM | POA: Diagnosis not present

## 2020-08-29 DIAGNOSIS — E119 Type 2 diabetes mellitus without complications: Secondary | ICD-10-CM | POA: Diagnosis not present

## 2020-08-29 DIAGNOSIS — H52223 Regular astigmatism, bilateral: Secondary | ICD-10-CM | POA: Diagnosis not present

## 2020-08-29 DIAGNOSIS — Z7984 Long term (current) use of oral hypoglycemic drugs: Secondary | ICD-10-CM | POA: Diagnosis not present

## 2020-08-29 LAB — HM DIABETES EYE EXAM

## 2020-09-06 ENCOUNTER — Ambulatory Visit (INDEPENDENT_AMBULATORY_CARE_PROVIDER_SITE_OTHER): Payer: Medicare HMO

## 2020-09-06 ENCOUNTER — Encounter: Payer: Self-pay | Admitting: Emergency Medicine

## 2020-09-06 ENCOUNTER — Ambulatory Visit
Admission: EM | Admit: 2020-09-06 | Discharge: 2020-09-06 | Disposition: A | Discharge status: Home / Self Care | Payer: Medicare HMO | Attending: Physician Assistant | Admitting: Physician Assistant

## 2020-09-06 ENCOUNTER — Telehealth: Payer: Self-pay

## 2020-09-06 ENCOUNTER — Other Ambulatory Visit: Payer: Self-pay | Admitting: Physician Assistant

## 2020-09-06 ENCOUNTER — Ambulatory Visit
Admission: RE | Admit: 2020-09-06 | Discharge: 2020-09-06 | Disposition: A | Discharge status: Home / Self Care | Payer: Medicare HMO | Source: Ambulatory Visit | Attending: Physician Assistant | Admitting: Physician Assistant

## 2020-09-06 ENCOUNTER — Other Ambulatory Visit: Payer: Self-pay

## 2020-09-06 DIAGNOSIS — R319 Hematuria, unspecified: Secondary | ICD-10-CM

## 2020-09-06 DIAGNOSIS — R109 Unspecified abdominal pain: Secondary | ICD-10-CM

## 2020-09-06 DIAGNOSIS — K769 Liver disease, unspecified: Secondary | ICD-10-CM | POA: Diagnosis not present

## 2020-09-06 DIAGNOSIS — R160 Hepatomegaly, not elsewhere classified: Secondary | ICD-10-CM

## 2020-09-06 DIAGNOSIS — R809 Proteinuria, unspecified: Secondary | ICD-10-CM | POA: Insufficient documentation

## 2020-09-06 DIAGNOSIS — R310 Gross hematuria: Secondary | ICD-10-CM | POA: Insufficient documentation

## 2020-09-06 DIAGNOSIS — N210 Calculus in bladder: Secondary | ICD-10-CM

## 2020-09-06 DIAGNOSIS — N200 Calculus of kidney: Secondary | ICD-10-CM | POA: Diagnosis not present

## 2020-09-06 DIAGNOSIS — N202 Calculus of kidney with calculus of ureter: Secondary | ICD-10-CM | POA: Diagnosis not present

## 2020-09-06 HISTORY — DX: Disorder of kidney and ureter, unspecified: N28.9

## 2020-09-06 LAB — CBC WITH DIFFERENTIAL/PLATELET
Abs Immature Granulocytes: 0.02 10*3/uL (ref 0.00–0.07)
Basophils Absolute: 0 10*3/uL (ref 0.0–0.1)
Basophils Relative: 1 %
Eosinophils Absolute: 0.4 10*3/uL (ref 0.0–0.5)
Eosinophils Relative: 6 %
HCT: 42.8 % (ref 39.0–52.0)
Hemoglobin: 14.4 g/dL (ref 13.0–17.0)
Immature Granulocytes: 0 %
Lymphocytes Relative: 28 %
Lymphs Abs: 2.1 10*3/uL (ref 0.7–4.0)
MCH: 30.2 pg (ref 26.0–34.0)
MCHC: 33.6 g/dL (ref 30.0–36.0)
MCV: 89.7 fL (ref 80.0–100.0)
Monocytes Absolute: 0.5 10*3/uL (ref 0.1–1.0)
Monocytes Relative: 7 %
Neutro Abs: 4.3 10*3/uL (ref 1.7–7.7)
Neutrophils Relative %: 58 %
Platelets: 283 10*3/uL (ref 150–400)
RBC: 4.77 MIL/uL (ref 4.22–5.81)
RDW: 12.5 % (ref 11.5–15.5)
WBC: 7.3 10*3/uL (ref 4.0–10.5)
nRBC: 0 % (ref 0.0–0.2)

## 2020-09-06 LAB — URINALYSIS, COMPLETE (UACMP) WITH MICROSCOPIC
Bacteria, UA: NONE SEEN
Glucose, UA: NEGATIVE mg/dL
Ketones, ur: 15 mg/dL — AB
Leukocytes,Ua: NEGATIVE
Nitrite: NEGATIVE
Protein, ur: 100 mg/dL — AB
RBC / HPF: >50 RBC/hpf (ref 0–5)
Specific Gravity, Urine: >1.03 — ABNORMAL HIGH (ref 1.005–1.030)
Squamous Epithelial / HPF: NONE SEEN (ref 0–5)
WBC, UA: NONE SEEN WBC/hpf (ref 0–5)
pH: 5.5 (ref 5.0–8.0)

## 2020-09-06 LAB — COMPREHENSIVE METABOLIC PANEL
ALT: 21 U/L (ref 0–44)
AST: 17 U/L (ref 15–41)
Albumin: 4.5 g/dL (ref 3.5–5.0)
Alkaline Phosphatase: 54 U/L (ref 38–126)
Anion gap: 8 (ref 5–15)
BUN: 16 mg/dL (ref 8–23)
CO2: 28 mmol/L (ref 22–32)
Calcium: 9.3 mg/dL (ref 8.9–10.3)
Chloride: 104 mmol/L (ref 98–111)
Creatinine, Ser: 0.91 mg/dL (ref 0.61–1.24)
GFR, Estimated: >60 mL/min (ref >60)
Glucose, Bld: 143 mg/dL — ABNORMAL HIGH (ref 70–99)
Potassium: 4.5 mmol/L (ref 3.5–5.1)
Sodium: 140 mmol/L (ref 135–145)
Total Bilirubin: 0.6 mg/dL (ref 0.3–1.2)
Total Protein: 7.8 g/dL (ref 6.5–8.1)

## 2020-09-06 NOTE — ED Provider Notes (Signed)
[facility]    CSN: [id number] Arrival date & time: 09/06/20  1655      History   Chief Complaint Chief Complaint  Patient presents with  . Hematuria    HPI Garrett Woodard is a 70 y.o. male presenting for gross hematuria since last night. He denies any other symptoms. Denies fever, fatigue, weakness, abdominal pain, nausea/vomiting/diarrhea, pelvic pain, testicular pain. Denies pain with urination and admits to "tingling." Has chronic back pain and denies any worsening of the pain recently. Has history of kidney stones, but says this does not feel like a stone since he really does not have pain.  Patient has no history of hematuria.  He denies any bladder conditions including bladder cancer.  Denies BPH.  Patient denies recent illness and not taking any anticoagulants.  Denies bleeding from any other sites.  Patient has significant medical history for type 2 diabetes, hypertension, hyperlipidemia, asthma, and CAD with history of CABG. overall, patient says that he does not feel that badly but is just concerned since he has noticed definite blood in the urine.  Patient has no other concerns today.  HPI  Past Medical History:  Diagnosis Date  . Allergy   . Asthma    cats  . Coronary artery disease 10/04/2003   Cabg atrial septal defect repair//Cath [facility]) EF 70%, 3 vessell dz. (Dr.Callwood)10/04/2003  . Diabetes mellitus    type 2  . Hyperlipidemia   . Hypertension   . Renal disorder     Patient Active Problem List   Diagnosis Date Noted  . Knee pain 06/27/2020  . Skin lesion 06/01/2019  . S/P CABG (coronary artery bypass graft) 05/20/2015  . CAD (coronary artery disease) 03/27/2015  . Advance care planning 03/18/2014  . Medicare welcome exam 12/10/2011  . Pure hypercholesterolemia 12/10/2011  . Sleep apnea 12/04/2008  . ESSENTIAL HYPERTENSION, BENIGN 03/22/2008  . Diabetes mellitus without complication ([place]) 27/25/3664  . HX, PERSONAL, TOBACCO USE, QUIT 03/05  04/06/2007    Past Surgical History:  Procedure Laterality Date  . CORONARY ANGIOPLASTY  10/04/2003   cath.  at [facility]): EF70%, 3 vessell dz. Dr. Gwenlyn Saran wnl 11/16/2003// Exercise Cardiolite wnl EF 60% 12/03/2003  . CORONARY ARTERY BYPASS GRAFT  10/2003   CABG atrial septal defect repair       Home Medications    Prior to Admission medications   Medication Sig Start Date End Date Taking? Authorizing Provider  aspirin 81 MG EC tablet Take 81 mg by mouth daily.   Yes [provider]  metFORMIN (GLUCOPHAGE) 500 MG tablet TAKE TWO TABLETS BY MOUTH TWICE A DAY. 05/23/20  Yes Tonia Ghent, MD  metoprolol tartrate (LOPRESSOR) 25 MG tablet TAKE 1/2 TABLET BY MOUTH EVERY DAY 05/23/20  Yes Tonia Ghent, MD  rosuvastatin (CRESTOR) 20 MG tablet TAKE 1 TABLET EVERY DAY 05/23/20  Yes Tonia Ghent, MD  albuterol (PROAIR HFA) 108 (90 Base) MCG/ACT inhaler Inhale 1 puff into the lungs every 6 (six) hours as needed for wheezing. 05/10/17   Tonia Ghent, MD  phentermine (ADIPEX-P) 37.5 MG tablet Take 37.5 mg by mouth daily. 03/26/20   [provider]    Family History Family History  Problem Relation Age of Onset  . Stroke Mother   . Hypertension Mother   . Depression Mother   . Heart disease Father 34       CABG x4  . Cancer Father        lymphoma at  age 78  . Alcohol abuse Sister        knee replacement bilaterial disabled  . Prostate cancer Neg Hx   . Colon cancer Neg Hx     Social History Social History   Tobacco Use  . Smoking status: Former Smoker    Types: Cigarettes    Quit date: 10/04/2002    Years since quitting: 17.9  . Smokeless tobacco: Never Used  Vaping Use  . Vaping Use: Never used  Substance Use Topics  . Alcohol use: Yes    Alcohol/week: 0.0 standard drinks    Comment: 5 to 6 drinks in a week  . Drug use: No     Allergies   Patient has no known allergies.   Review of Systems Review of Systems  Constitutional:  Negative for fatigue and fever.  Respiratory: Negative for shortness of breath.   Cardiovascular: Negative for chest pain.  Gastrointestinal: Negative for abdominal pain, anal bleeding, constipation, diarrhea, nausea and vomiting.  Genitourinary: Positive for hematuria. Negative for decreased urine volume, difficulty urinating, dysuria, flank pain, frequency, genital sores, penile discharge, penile pain, penile swelling, scrotal swelling, testicular pain and urgency.  Musculoskeletal: Negative for arthralgias.  Skin: Negative for rash.  Neurological: Negative for weakness.  Hematological: Does not bruise/bleed easily.     Physical Exam Triage Vital Signs ED Triage Vitals  Enc Vitals Group     BP 09/06/20 1718 (!) 145/83     Pulse Rate 09/06/20 1718 74     Resp 09/06/20 1718 16     Temp 09/06/20 1718 98.3 F (36.8 C)     Temp Source 09/06/20 1718 Oral     SpO2 09/06/20 1718 100 %     Weight 09/06/20 1715 209 lb (94.8 kg)     Height 09/06/20 1715 6\' 2"  (1.88 m)     Head Circumference --      Peak Flow --      Pain Score 09/06/20 1715 0     Pain Loc --      Pain Edu? --      Excl. in [place]? --    No data found.  Updated Vital Signs BP (!) 145/83 (BP Location: Right Arm)   Pulse 74   Temp 98.3 F (36.8 C) (Oral)   Resp 16   Ht 6\' 2"  (1.88 m)   Wt 209 lb (94.8 kg)   SpO2 100%   BMI 26.83 kg/m       Physical Exam Vitals and nursing note reviewed.  Constitutional:      General: He is not in acute distress.    Appearance: Normal appearance. He is well-developed and well-nourished. He is not ill-appearing, toxic-appearing or diaphoretic.  HENT:     Head: Normocephalic and atraumatic.     Mouth/Throat:     Mouth: Mucous membranes are moist.     Pharynx: Oropharynx is clear.  Eyes:     General: No scleral icterus.    Conjunctiva/sclera: Conjunctivae normal.  Cardiovascular:     Rate and Rhythm: Normal rate and regular rhythm.     Heart sounds: Normal heart sounds.   Pulmonary:     Effort: Pulmonary effort is normal. No respiratory distress.     Breath sounds: Normal breath sounds.  Abdominal:     Palpations: Abdomen is soft.     Tenderness: There is no abdominal tenderness. There is no right CVA tenderness or left CVA tenderness.  Musculoskeletal:        General: No edema.  Cervical back: Neck supple.  Skin:    General: Skin is warm and dry.  Neurological:     General: No focal deficit present.     Mental Status: He is alert. Mental status is at baseline.     Motor: No weakness.     Gait: Gait normal.  Psychiatric:        Mood and Affect: Mood and affect and mood normal.        Behavior: Behavior normal.        Thought Content: Thought content normal.      UC Treatments / Results  Labs (all labs ordered are listed, but only abnormal results are displayed) Labs Reviewed  URINALYSIS, COMPLETE (UACMP) WITH MICROSCOPIC - Abnormal; Notable for the following components:      Result Value   Color, Urine BROWN (*)    APPearance CLOUDY (*)    Specific Gravity, Urine >1.030 (*)    Hgb urine dipstick LARGE (*)    Bilirubin Urine SMALL (*)    Ketones, ur 15 (*)    Protein, ur 100 (*)    All other components within normal limits  COMPREHENSIVE METABOLIC PANEL - Abnormal; Notable for the following components:   Glucose, Bld 143 (*)    All other components within normal limits  URINE CULTURE  CBC WITH DIFFERENTIAL/PLATELET    EKG   Radiology DG Abdomen 1 View  Result Date: 09/06/2020 CLINICAL DATA:  70 year old male with hematuria. EXAM: ABDOMEN - 1 VIEW COMPARISON:  None. FINDINGS: Punctate radiopaque focus in the left upper abdomen may be artifactual or related to bowel content. A small stone in the upper pole of the left kidney is not excluded. A 9 mm radiopaque focus is noted in the right upper abdomen at the level of T12-L1 disc space. No bowel dilatation or evidence of obstruction. No free air. The osseous structures are intact.  IMPRESSION: 1. Artifact versus a punctate left renal upper pole stone. 2. A 9 mm radiopaque focus in the right upper abdomen. 3. No bowel obstruction. Electronically Signed   By: Anner Crete M.D.   On: 09/06/2020 17:46   CT RENAL STONE STUDY  Result Date: 09/06/2020 CLINICAL DATA:  Left flank pain, hematuria, proteinuria EXAM: CT ABDOMEN AND PELVIS WITHOUT CONTRAST TECHNIQUE: Multidetector CT imaging of the abdomen and pelvis was performed following the standard protocol without IV contrast. COMPARISON:  09/06/2020 FINDINGS: Lower chest: No acute pleural or parenchymal lung disease. Unenhanced CT was performed per clinician order. Lack of IV contrast limits sensitivity and specificity, especially for evaluation of abdominal/pelvic solid viscera. Hepatobiliary: Choose graphic decreased attenuation throughout the liver consistent with hepatic steatosis. Indeterminate hypodensity right lobe liver image 22/2 measuring 2.4 x 3.8 cm. Gallbladder is unremarkable. No biliary dilation. Pancreas: Unremarkable. No pancreatic ductal dilatation or surrounding inflammatory changes. Spleen: Normal in size without focal abnormality. Adrenals/Urinary Tract: There are bilateral renal cortical cysts. 2 mm nonobstructing right renal calculus is seen image 38/2. No left-sided calculi. No obstructive uropathy. There is a 5 mm calculus within the urinary bladder. Faint calcification is seen at the left UVJ measuring 3 mm, which could reflect a nonobstructing left UVJ calculus. The remainder of the bladder is unremarkable. The adrenals are normal. Stomach/Bowel: No bowel obstruction or ileus. Diverticulosis of the sigmoid colon without diverticulitis. Normal appendix right lower quadrant. No bowel wall thickening or inflammatory change. Vascular/Lymphatic: Aortic atherosclerosis. No enlarged abdominal or pelvic lymph nodes. Reproductive: Prostate is unremarkable. Other: No free fluid or free gas.  Bilateral fat containing inguinal  hernias. No bowel herniation. Musculoskeletal: No acute or destructive bony lesions. There is bilateral spondylolysis of L5 with grade 1 anterolisthesis of L5 on S1. Reconstructed images demonstrate no additional findings. IMPRESSION: 1. Bladder calculi as above, with a 3 mm nonobstructing calculus at the left UVJ and a 5 mm calculus within the bladder lumen. 2. A 2 mm nonobstructing right renal calculus. 3. Hepatic steatosis. 4. Indeterminate 3.8 cm right lobe liver hypodensity, which could reflect a cyst or hemangioma. Dedicated liver MRI could be performed for complete characterization. 5. 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 6. Aortic Atherosclerosis (ICD10-I70.0). Electronically Signed   By: Randa Ngo M.D.   On: 09/06/2020 20:15    Procedures Procedures (including critical care time)  Medications Ordered in UC Medications - No data to display  Initial Impression / Assessment and Plan / UC Course  I have reviewed the triage vital signs and the nursing notes.  Pertinent labs & imaging results that were available during my care of the patient were reviewed by me and considered in my medical decision making (see chart for details).   70 year old male presenting with gross hematuria.  In the clinic, he is well-appearing and in no acute distress.  All vital signs are stable.   Top differentials include nephrolithiasis, ureterolithiasis, acute cystitis, bladder cancer or lesion, BPH  Urinalysis performed today shows cloudy brownish colored urine with specific gravity greater than 1.030.  Large blood, small bilirubin, 15 ketones, and 100 protein.  Send the urine for culture to ensure no UTI, but not suspected.  CBC and CMP performed.  Glucose elevated at 143, otherwise normal labs.  Normal hemoglobin, creatinine and BUN.  KUB performed today shows:  1. Artifact versus a punctate left renal upper pole stone. 2. A 9 mm radiopaque focus in the right upper  abdomen. 3. No bowel obstruction   CT of abdomen and pelvis obtained and patient sent to [facility] for outpatient imaging at this time. Advised patient that I will call with the results of the scan in the morning unless there is something critical found.   ED precautions discussed with patient.   CT Renal Stone Study shows:  1. Bladder calculi as above, with a 3 mm nonobstructing calculus at the left UVJ and a 5 mm calculus within the bladder lumen. 2. A 2 mm nonobstructing right renal calculus. 3. Hepatic steatosis. 4. Indeterminate 3.8 cm right lobe liver hypodensity, which could reflect a cyst or hemangioma. Dedicated liver MRI could be performed for complete characterization. 5. 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 6. Aortic Atherosclerosis (ICD10-I70.0).  Discussed results with patient. Sent Flomax. Advised supportive care. Advised that I will refer to urology at this time. He should increase rest and fluids. To ED for any severe pain, fever, fatigue/weakness, bleeding from other sites. Patient agreeable.  Incidental finding of 3.8 right lobe liver hypodensity. Advised to make follow up appointment with PCP as he may need further imaging to characterize.   Final Clinical Impressions(s) / UC Diagnoses   Final diagnoses:  Gross hematuria  Proteinuria, unspecified type  Bladder calculi  Nephrolithiasis  Liver lesion, right lobe     Discharge Instructions     You have blood and protein in your urine.  This does not appear to be an infection, but I will culture the urine and someone will contact you if you need to start antibiotic for UTI.  Your blood work was reassuring.  Your kidney function is normal.  No signs of significant blood loss.  The x-ray of your abdomen suggest possible kidney stone.  As discussed, there are many different causes of hematuria.  Main causes for urinary infection, kidney stones, and bladder cancer.  The need for  CT of abdomen and pelvis at this time to assess further the possibility of kidney stones and any other causes that could lead to hematuria.  I will call you with the results of CT scan tomorrow.  You should go home and rest increase your fluid intake.  If you develop a fever or have abdominal pain or significant back pain or weakness you should call EMS or go to ED.  I will refer you to urology. [facility]: Royston Cowper MD, Urologist [place], [place]  [phone]    ED Prescriptions    None     PDMP not reviewed this encounter.   Danton Clap, PA-C 09/07/20 1016

## 2020-09-06 NOTE — ED Notes (Signed)
CT at hospital states pt can come over to ED to register.

## 2020-09-06 NOTE — Telephone Encounter (Signed)
Pt spoke with access nurse earlier today and I received fax that pt had blood in urine but pt cancelled call and did  Not want cb from  Nurse. The note did not come thru portal but did receive fax which original being sent for scanning and copy taken to Dr Josefine Class inbox. I spoke with pt and pt said has had back pain on and off "forever" so not sure if this relates to blood in urine. No abd pain. 09/05/20 pt first saw bright red blood in urine that appeared significant amt that has continued today. No frequency of urine but slight tingling when urinating (not pain). No fever. Pt had kidiney stones 15 yrs ago. Pt is going to [facility] in [place] for eval and testing. FYI to Dr Damita Dunnings.

## 2020-09-06 NOTE — Discharge Instructions (Addendum)
You have blood and protein in your urine.  This does not appear to be an infection, but I will culture the urine and someone will contact you if you need to start antibiotic for UTI.  Your blood work was reassuring.  Your kidney function is normal.  No signs of significant blood loss.  The x-ray of your abdomen suggest possible kidney stone.  As discussed, there are many different causes of hematuria.  Main causes for urinary infection, kidney stones, and bladder cancer.  The need for CT of abdomen and pelvis at this time to assess further the possibility of kidney stones and any other causes that could lead to hematuria.  I will call you with the results of CT scan tomorrow.  You should go home and rest increase your fluid intake.  If you develop a fever or have abdominal pain or significant back pain or weakness you should call EMS or go to ED.  I will refer you to urology. [facility]: Royston Cowper MD, Urologist [place], [place]  [phone]

## 2020-09-06 NOTE — ED Triage Notes (Signed)
Patient states that he noticed blood in his urine starting yesterday.  Patient denies any pain.  Patient reports kidney stone 15 years ago.  Patient denies fevers or chills.  Patient denies N/V.

## 2020-09-07 ENCOUNTER — Telehealth: Payer: Self-pay | Admitting: Physician Assistant

## 2020-09-07 DIAGNOSIS — N200 Calculus of kidney: Secondary | ICD-10-CM

## 2020-09-07 MED ORDER — TAMSULOSIN HCL 0.4 MG PO CAPS: 0.4000 mg | ORAL_CAPSULE | Freq: Every day | ORAL | 0 refills | Status: AC

## 2020-09-07 NOTE — Telephone Encounter (Signed)
Discussed CT results with patient. Advised patient of multiple stones including one in the bladder, one at the UVJ junction, and one in the right kidney. Also advised of abnormal liver findings. Patient advised to follow-up with PCP regarding the abnormal liver findings. Sent tamsulosin for the stones. Placed referral to urology. ED precautions reviewed.  Referred to: [facility]: Royston Cowper MD, Urologist [place], [place]  [phone]

## 2020-09-08 NOTE — Telephone Encounter (Signed)
Please call patient.  Outside imaging showed fatty liver and an indeterminate 3.8 cm right lobe liver hypodensity, which could reflect a cyst or hemangioma (both of those being benign).  This may not be anything significant but he needs a follow-up liver MRI and I went ahead and put in the order.  I saw that he had kidney stones noted.  I hope he is feeling better in the meantime.  Thanks.

## 2020-09-09 NOTE — Telephone Encounter (Signed)
Spoke with patient. He was already aware of results per UC doctor. He does not want to do MRI at this time. He wants to wait and discuss this at his next OV in march. He states he is set up for follow up with urology this month as well. He is feeling much better. Patient is planning on cutting back his alcohol consummation as well.

## 2020-09-09 NOTE — ED Notes (Signed)
Patient has been approved for CPT [phone]. Obtained via [facility] . Valid for 09/06/2020 through 10/06/2020. Authorization # [id number].

## 2020-09-10 LAB — URINE CULTURE: Culture: 10000 — AB

## 2020-09-10 NOTE — Telephone Encounter (Signed)
Noted. Thanks.   MRI cancelled.

## 2020-09-11 ENCOUNTER — Telehealth (HOSPITAL_COMMUNITY): Payer: Self-pay | Admitting: Emergency Medicine

## 2020-09-11 MED ORDER — CEPHALEXIN 500 MG PO CAPS: 500.0000 mg | ORAL_CAPSULE | Freq: Two times a day (BID) | ORAL | 0 refills | Status: AC

## 2020-09-25 DIAGNOSIS — R310 Gross hematuria: Secondary | ICD-10-CM | POA: Diagnosis not present

## 2020-09-25 DIAGNOSIS — N210 Calculus in bladder: Secondary | ICD-10-CM | POA: Diagnosis not present

## 2020-09-25 DIAGNOSIS — N401 Enlarged prostate with lower urinary tract symptoms: Secondary | ICD-10-CM | POA: Diagnosis not present

## 2020-09-25 DIAGNOSIS — Z125 Encounter for screening for malignant neoplasm of prostate: Secondary | ICD-10-CM | POA: Diagnosis not present

## 2020-09-30 DIAGNOSIS — R310 Gross hematuria: Secondary | ICD-10-CM | POA: Diagnosis not present

## 2020-09-30 DIAGNOSIS — Z125 Encounter for screening for malignant neoplasm of prostate: Secondary | ICD-10-CM | POA: Diagnosis not present

## 2020-09-30 DIAGNOSIS — N210 Calculus in bladder: Secondary | ICD-10-CM | POA: Diagnosis not present

## 2020-09-30 DIAGNOSIS — N401 Enlarged prostate with lower urinary tract symptoms: Secondary | ICD-10-CM | POA: Diagnosis not present

## 2020-10-08 DIAGNOSIS — R310 Gross hematuria: Secondary | ICD-10-CM | POA: Diagnosis not present

## 2020-10-08 DIAGNOSIS — C675 Malignant neoplasm of bladder neck: Secondary | ICD-10-CM | POA: Diagnosis not present

## 2020-10-14 NOTE — H&P (Signed)
NAME: Garrett Woodard, KELLEY MEDICAL RECORD NO: [id number] ACCOUNT NO: [id number] DATE OF BIRTH: 07-10-1951 PHYSICIAN: Otelia Limes. Yves Dill, MD  History and Physical   DATE OF ADMISSION: 11/07/2020  Same day surgery 11/07/2020.  CHIEF COMPLAINT:  Bladder tumor.  HISTORY OF PRESENT ILLNESS:  The patient is a 70 year old Caucasian male who developed gross painless hematuria on 02/03.  He was evaluated with CT scan, which revealed a 2 mm right renal calculus, a 5 mm bladder calculus, and possible 3 mm left UVJ  calculus.  He was evaluated with cystoscopy in the office and found to have a 10 mm papillary bladder tumor at the left bladder neck near the left ureteral orifice.  No bladder stones were seen at that time.  He comes in now for transurethral resection  of bladder tumor and instillation of mitomycin C into the bladder.  PAST MEDICAL HISTORY:  ALLERGIES:  No drug allergies.  CURRENT MEDICATIONS:  Metformin, aspirin, lovastatin, metoprolol, and tamsulosin.  PAST SURGICAL HISTORY:  Coronary artery bypass graft x3 in 2005.  PAST AND CURRENT MEDICAL CONDITIONS: 1.  Hypertension. 2.  Hypercholesterolemia. 3.  Diabetes. 4.  Coronary artery disease. 5.  Kidney stones.  REVIEW OF SYSTEMS:  Positive for allergic rhinitis.  He denies chest pain, shortness of breath, or stroke.  SOCIAL HISTORY:  The patient quit smoking in 2005, with a 5-pack-year history.  He consumes 7 alcoholic beverages per week.  FAMILY HISTORY:  Father died at age 25 of heart disease.  Mother died at age 42 of a stroke.  There is no family history of urological malignancy.  PHYSICAL EXAMINATION: VITAL SIGNS:  Height 6 feet 1 inch, weight 216 pounds. GENERAL:  Well-nourished white male in no acute distress. HEENT:  Sclerae were clear.  Pupils were equally round, reactive to light and accommodation.  Extraocular movements were intact. NECK:  No palpable thyroid masses.  No audible carotid bruits. LYMPHATIC:  No palpable  cervical or inguinal adenopathy. PULMONARY:  Lungs are clear to auscultation. CARDIOVASCULAR:  Regular rhythm and rate without audible murmurs. ABDOMEN:  Soft, nontender abdomen. GENITOURINARY:  The patient was circumcised.  Testes were smooth, nontender, approximately 20 mL size each. RECTAL:  35 gram, smooth, nontender prostate. NEUROMUSCULAR:  Alert and oriented x3.  IMPRESSION:  Bladder cancer.  PLAN:  Transurethral resection of bladder tumor and instillation of mitomycin C.   ROH D: 10/09/2020 2:45:50 pm T: 10/09/2020 4:45:00 pm  JOB: [id number]/ [id number]

## 2020-10-22 ENCOUNTER — Other Ambulatory Visit: Payer: Self-pay

## 2020-10-22 ENCOUNTER — Ambulatory Visit (INDEPENDENT_AMBULATORY_CARE_PROVIDER_SITE_OTHER): Payer: Medicare HMO | Admitting: Family Medicine

## 2020-10-22 ENCOUNTER — Encounter: Payer: Self-pay | Admitting: Family Medicine

## 2020-10-22 VITALS — BP 140/78 | HR 84 | Temp 97.9°F | Ht 74.0 in | Wt 215.0 lb

## 2020-10-22 DIAGNOSIS — E119 Type 2 diabetes mellitus without complications: Secondary | ICD-10-CM

## 2020-10-22 LAB — POCT GLYCOSYLATED HEMOGLOBIN (HGB A1C): Hemoglobin A1C: 7.5 % — AB (ref 4.0–5.6)

## 2020-10-22 MED ORDER — METFORMIN HCL 500 MG PO TABS: ORAL_TABLET | ORAL | Status: DC

## 2020-10-22 NOTE — Progress Notes (Signed)
This visit occurred during the SARS-CoV-2 public health emergency.  Safety protocols were in place, including screening questions prior to the visit, additional usage of staff PPE, and extensive cleaning of exam room while observing appropriate contact time as indicated for disinfecting solutions.  Diabetes:  Using medications without difficulties: yes Hypoglycemic episodes:no, he gets a snack prior to exercise.   Hyperglycemic episodes:no Feet problems:no Blood Sugars averaging: not checked.   eye exam within last year: yes A1c improved to 7.5.   He is working on diet and exercise.  He is playing tennis regularly.    He had urology f/u pending.  D/w pt.  No blood in urine.    Outside imaging showed fatty liver and an indeterminate 3.8 cm right lobe liver hypodensity, which could reflect a cyst or hemangioma (both of those being benign). This may not be anything significant but he needs a follow-up liver MRI.  Needs set up after urology procedure, after 11/07/20, discussed with patient.  Meds, vitals, and allergies reviewed.  ROS: Per HPI unless specifically indicated in ROS section   GEN: nad, alert and oriented HEENT: ncat NECK: supple w/o LA CV: rrr. PULM: ctab, no inc wob ABD: soft, +bs EXT: no edema SKIN: well perfused.

## 2020-10-22 NOTE — Patient Instructions (Signed)
Don't change your meds for now.  I'll await the urology notes.  We'll set up the MRI after that.  Plan on recheck in about 3 months, A1c at the visit.  Take care.  Glad to see you.

## 2020-10-23 NOTE — Assessment & Plan Note (Signed)
A1c improved.  Continue Metformin.  Continue work on diet and exercise.  He will get through his urology procedure and we will go from there.  We discussed his follow-up liver imaging and he wanted to get his urology procedure taken care of first.  I think that is reasonable.

## 2020-10-29 ENCOUNTER — Encounter
Admission: RE | Admit: 2020-10-29 | Discharge: 2020-10-29 | Disposition: A | Discharge status: Home / Self Care | Payer: Medicare HMO | Source: Ambulatory Visit | Attending: Urology | Admitting: Urology

## 2020-10-29 ENCOUNTER — Other Ambulatory Visit: Payer: Self-pay

## 2020-10-29 HISTORY — DX: Sleep apnea, unspecified: G47.30

## 2020-10-29 HISTORY — DX: Personal history of urinary calculi: Z87.442

## 2020-10-29 NOTE — Patient Instructions (Signed)
Your procedure is scheduled on: Thursday 11/07/20.  Report to THE FIRST FLOOR REGISTRATION DESK IN THE MEDICAL MALL ON THE MORNING OF SURGERY FIRST, THEN YOU WILL CHECK IN AT THE SURGERY INFORMATION DESK LOCATED OUTSIDE THE SAME DAY SURGERY DEPARTMENT LOCATED ON 2ND FLOOR MEDICAL MALL ENTRANCE.  To find out your arrival time please call [phone] between 1PM - 3PM on Wednesday 11/06/20.   Remember: Instructions that are not followed completely may result in serious medical risk, up to and including death, or upon the discretion of your surgeon and anesthesiologist your surgery may need to be rescheduled.     __X__ 1. Do not eat food after midnight the night before your procedure.                 No gum chewing or hard candies. You may drink clear liquids up to 2 hours                 before you are scheduled to arrive for your surgery- DO NOT drink clear                 liquids within 2 hours of the start of your surgery.                 Clear Liquids include:  water, apple juice without pulp, clear carbohydrate                 drink such as Clearfast or Gatorade, Black Coffee or Tea (Do not add                 milk or creamer to coffee or tea).  __X__2.  On the morning of surgery brush your teeth with toothpaste and water, you may rinse your mouth with mouthwash if you wish.  Do not swallow any toothpaste or mouthwash.    __X__ 3.  No Alcohol for 24 hours before or after surgery.  __X__ 4.  Do Not Smoke or use e-cigarettes For 24 Hours Prior to Your Surgery.                 Do not use any chewable tobacco products for at least 6 hours prior to                 surgery.  __X__5.  Notify your doctor if there is any change in your medical condition      (cold, fever, infections).      Do NOT wear jewelry, make-up, hairpins, clips or nail polish. Do NOT wear lotions, powders, or perfumes.  Do NOT shave 48 hours prior to surgery. Men may shave face and neck. Do NOT bring valuables to the  hospital.     [facility] is not responsible for any belongings or valuables.   Contacts, dentures/partials or body piercings may not be worn into surgery. Bring a case for your contacts, glasses or hearing aids, a denture cup will be supplied.    Patients discharged the day of surgery will not be allowed to drive home.     __X__ Take these medicines the morning of surgery with A SIP OF WATER:     1. metoprolol tartrate (LOPRESSOR)   2. rosuvastatin (CRESTOR)   3. tamsulosin (FLOMAX)    __X__ Stop Metformin 2 days prior to surgery. Your last dose will be on Monday 11/04/20.    __X__ Stop Blood Thinners: Aspirin. Stop this three days prior to your procedure.    __X__ Stop Anti-inflammatories  7 days before surgery such as Advil, Ibuprofen, Motrin, BC or Goodies Powder, Naprosyn, Naproxen, Aleve, Aspirin, Meloxicam. May take Tylenol if needed for pain or discomfort.   __X__Do not start taking any new herbal supplements or vitamins prior to your procedure.     Wear comfortable clothing (specific to your surgery type) to the hospital.  Plan for stool softeners for home use; pain medications have a tendency to cause constipation. You can also help prevent constipation by eating foods high in fiber such as fruits and vegetables and drinking plenty of fluids as your diet allows.  After surgery, you can prevent lung complications by doing breathing exercises.Take deep breaths and cough every 1-2 hours. Your doctor may order a device called an Incentive Spirometer to help you take deep breaths.  Please call the [place] Department at [phone] if you have any questions about these instructions.

## 2020-11-05 ENCOUNTER — Other Ambulatory Visit
Admission: RE | Admit: 2020-11-05 | Discharge: 2020-11-05 | Disposition: A | Discharge status: Home / Self Care | Payer: Medicare HMO | Source: Ambulatory Visit | Attending: Urology | Admitting: Urology

## 2020-11-05 ENCOUNTER — Other Ambulatory Visit: Payer: Self-pay

## 2020-11-05 DIAGNOSIS — Z01812 Encounter for preprocedural laboratory examination: Secondary | ICD-10-CM | POA: Insufficient documentation

## 2020-11-05 DIAGNOSIS — Z20822 Contact with and (suspected) exposure to covid-19: Secondary | ICD-10-CM | POA: Diagnosis not present

## 2020-11-05 LAB — SARS CORONAVIRUS 2 (TAT 6-24 HRS): SARS Coronavirus 2: NEGATIVE

## 2020-11-07 ENCOUNTER — Ambulatory Visit: Payer: Medicare HMO | Admitting: Anesthesiology

## 2020-11-07 ENCOUNTER — Encounter
Admission: RE | Disposition: A | Discharge status: Home / Self Care | Payer: Self-pay | Source: Home / Self Care | Attending: Urology

## 2020-11-07 ENCOUNTER — Encounter: Payer: Self-pay | Admitting: Urology

## 2020-11-07 ENCOUNTER — Ambulatory Visit
Admission: RE | Admit: 2020-11-07 | Discharge: 2020-11-07 | Disposition: A | Discharge status: Home / Self Care | Payer: Medicare HMO | Attending: Urology | Admitting: Urology

## 2020-11-07 ENCOUNTER — Other Ambulatory Visit: Payer: Self-pay

## 2020-11-07 DIAGNOSIS — Z951 Presence of aortocoronary bypass graft: Secondary | ICD-10-CM | POA: Diagnosis not present

## 2020-11-07 DIAGNOSIS — C676 Malignant neoplasm of ureteric orifice: Secondary | ICD-10-CM

## 2020-11-07 DIAGNOSIS — I2510 Atherosclerotic heart disease of native coronary artery without angina pectoris: Secondary | ICD-10-CM | POA: Insufficient documentation

## 2020-11-07 DIAGNOSIS — D494 Neoplasm of unspecified behavior of bladder: Secondary | ICD-10-CM | POA: Insufficient documentation

## 2020-11-07 DIAGNOSIS — Z87891 Personal history of nicotine dependence: Secondary | ICD-10-CM | POA: Insufficient documentation

## 2020-11-07 DIAGNOSIS — Z87442 Personal history of urinary calculi: Secondary | ICD-10-CM | POA: Diagnosis not present

## 2020-11-07 DIAGNOSIS — E7800 Pure hypercholesterolemia, unspecified: Secondary | ICD-10-CM | POA: Insufficient documentation

## 2020-11-07 DIAGNOSIS — E119 Type 2 diabetes mellitus without complications: Secondary | ICD-10-CM | POA: Diagnosis not present

## 2020-11-07 DIAGNOSIS — D414 Neoplasm of uncertain behavior of bladder: Secondary | ICD-10-CM | POA: Diagnosis not present

## 2020-11-07 DIAGNOSIS — I25810 Atherosclerosis of coronary artery bypass graft(s) without angina pectoris: Secondary | ICD-10-CM | POA: Diagnosis not present

## 2020-11-07 DIAGNOSIS — I10 Essential (primary) hypertension: Secondary | ICD-10-CM | POA: Insufficient documentation

## 2020-11-07 DIAGNOSIS — C675 Malignant neoplasm of bladder neck: Secondary | ICD-10-CM | POA: Diagnosis not present

## 2020-11-07 DIAGNOSIS — Z8249 Family history of ischemic heart disease and other diseases of the circulatory system: Secondary | ICD-10-CM | POA: Diagnosis not present

## 2020-11-07 DIAGNOSIS — C672 Malignant neoplasm of lateral wall of bladder: Secondary | ICD-10-CM | POA: Diagnosis present

## 2020-11-07 HISTORY — PX: TRANSURETHRAL RESECTION OF BLADDER TUMOR: SHX2575

## 2020-11-07 LAB — GLUCOSE, CAPILLARY
Glucose-Capillary: 194 mg/dL — ABNORMAL HIGH (ref 70–99)
Glucose-Capillary: 156 mg/dL — ABNORMAL HIGH (ref 70–99)

## 2020-11-07 SURGERY — TURBT (TRANSURETHRAL RESECTION OF BLADDER TUMOR)
Anesthesia: General

## 2020-11-07 MED ORDER — ORAL CARE MOUTH RINSE: 15.0000 mL | Freq: Once | OROMUCOSAL | Status: AC

## 2020-11-07 MED ORDER — CHLORHEXIDINE GLUCONATE 0.12 % MT SOLN
OROMUCOSAL | Status: AC
  Administered 2020-11-07: 15 mL via OROMUCOSAL
  Filled 2020-11-07: qty 15

## 2020-11-07 MED ORDER — EPHEDRINE SULFATE 50 MG/ML IJ SOLN
INTRAMUSCULAR | Status: DC | PRN
  Administered 2020-11-07: 10 mg via INTRAVENOUS

## 2020-11-07 MED ORDER — CEFAZOLIN SODIUM-DEXTROSE 1-4 GM/50ML-% IV SOLN
INTRAVENOUS | Status: AC
  Filled 2020-11-07: qty 50

## 2020-11-07 MED ORDER — LIDOCAINE HCL (PF) 2 % IJ SOLN
INTRAMUSCULAR | Status: AC
  Filled 2020-11-07: qty 5

## 2020-11-07 MED ORDER — LIDOCAINE HCL URETHRAL/MUCOSAL 2 % EX GEL
CUTANEOUS | Status: AC
  Filled 2020-11-07: qty 10

## 2020-11-07 MED ORDER — HYOSCYAMINE SULFATE SL 0.125 MG SL SUBL: 0.1250 mg | SUBLINGUAL_TABLET | SUBLINGUAL | 3 refills | Status: DC | PRN

## 2020-11-07 MED ORDER — SUGAMMADEX SODIUM 500 MG/5ML IV SOLN
INTRAVENOUS | Status: DC | PRN
  Administered 2020-11-07: 200 mg via INTRAVENOUS

## 2020-11-07 MED ORDER — SODIUM CHLORIDE 0.9 % IV SOLN: INTRAVENOUS | Status: DC

## 2020-11-07 MED ORDER — HYOSCYAMINE SULFATE 0.125 MG SL SUBL
0.1250 mg | SUBLINGUAL_TABLET | SUBLINGUAL | Status: DC | PRN
  Administered 2020-11-07: 0.125 mg via SUBLINGUAL
  Filled 2020-11-07 (×2): qty 1

## 2020-11-07 MED ORDER — LIDOCAINE HCL (CARDIAC) PF 100 MG/5ML IV SOSY
PREFILLED_SYRINGE | INTRAVENOUS | Status: DC | PRN
  Administered 2020-11-07: 100 mg via INTRAVENOUS

## 2020-11-07 MED ORDER — CEFAZOLIN SODIUM-DEXTROSE 1-4 GM/50ML-% IV SOLN
1.0000 g | Freq: Once | INTRAVENOUS | Status: AC
  Administered 2020-11-07: 1 g via INTRAVENOUS

## 2020-11-07 MED ORDER — URIBEL 118 MG PO CAPS: 1.0000 | ORAL_CAPSULE | Freq: Four times a day (QID) | ORAL | 3 refills | Status: DC | PRN

## 2020-11-07 MED ORDER — FAMOTIDINE 20 MG PO TABS: 20.0000 mg | ORAL_TABLET | Freq: Once | ORAL | Status: AC

## 2020-11-07 MED ORDER — FENTANYL CITRATE (PF) 100 MCG/2ML IJ SOLN
INTRAMUSCULAR | Status: DC | PRN
  Administered 2020-11-07 (×2): 25 ug via INTRAVENOUS
  Administered 2020-11-07 (×2): 50 ug via INTRAVENOUS

## 2020-11-07 MED ORDER — ROCURONIUM BROMIDE 10 MG/ML (PF) SYRINGE
PREFILLED_SYRINGE | INTRAVENOUS | Status: AC
  Filled 2020-11-07: qty 10

## 2020-11-07 MED ORDER — ONDANSETRON HCL 4 MG/2ML IJ SOLN: 4.0000 mg | Freq: Once | INTRAMUSCULAR | Status: DC | PRN

## 2020-11-07 MED ORDER — LIDOCAINE HCL (CARDIAC) PF 100 MG/5ML IV SOSY: PREFILLED_SYRINGE | INTRAVENOUS | Status: DC | PRN

## 2020-11-07 MED ORDER — ROCURONIUM BROMIDE 100 MG/10ML IV SOLN
INTRAVENOUS | Status: DC | PRN
  Administered 2020-11-07: 45 mg via INTRAVENOUS

## 2020-11-07 MED ORDER — MITOMYCIN CHEMO FOR BLADDER INSTILLATION 40 MG
INTRAVENOUS | Status: DC | PRN
  Administered 2020-11-07: 40 mg via INTRAVESICAL

## 2020-11-07 MED ORDER — FENTANYL CITRATE (PF) 100 MCG/2ML IJ SOLN: 25.0000 ug | INTRAMUSCULAR | Status: DC | PRN

## 2020-11-07 MED ORDER — LIDOCAINE HCL URETHRAL/MUCOSAL 2 % EX GEL
CUTANEOUS | Status: DC | PRN
  Administered 2020-11-07: 1 via URETHRAL

## 2020-11-07 MED ORDER — CHLORHEXIDINE GLUCONATE 0.12 % MT SOLN: 15.0000 mL | Freq: Once | OROMUCOSAL | Status: AC

## 2020-11-07 MED ORDER — PROPOFOL 10 MG/ML IV BOLUS
INTRAVENOUS | Status: AC
  Filled 2020-11-07: qty 20

## 2020-11-07 MED ORDER — FENTANYL CITRATE (PF) 100 MCG/2ML IJ SOLN
INTRAMUSCULAR | Status: AC
  Filled 2020-11-07: qty 2

## 2020-11-07 MED ORDER — BELLADONNA ALKALOIDS-OPIUM 16.2-60 MG RE SUPP
RECTAL | Status: AC
  Filled 2020-11-07: qty 1

## 2020-11-07 MED ORDER — BELLADONNA ALKALOIDS-OPIUM 16.2-30 MG RE SUPP
RECTAL | Status: DC | PRN
  Administered 2020-11-07: 1 via RECTAL

## 2020-11-07 MED ORDER — DEXMEDETOMIDINE (PRECEDEX) IN NS 20 MCG/5ML (4 MCG/ML) IV SYRINGE
PREFILLED_SYRINGE | INTRAVENOUS | Status: DC | PRN
  Administered 2020-11-07 (×3): 4 ug via INTRAVENOUS

## 2020-11-07 MED ORDER — CIPROFLOXACIN HCL 500 MG PO TABS: 500.0000 mg | ORAL_TABLET | Freq: Two times a day (BID) | ORAL | 0 refills | Status: DC

## 2020-11-07 MED ORDER — PROPOFOL 10 MG/ML IV BOLUS
INTRAVENOUS | Status: DC | PRN
  Administered 2020-11-07: 160 mg via INTRAVENOUS

## 2020-11-07 MED ORDER — PHENYLEPHRINE HCL (PRESSORS) 10 MG/ML IV SOLN
INTRAVENOUS | Status: DC | PRN
  Administered 2020-11-07: 100 ug via INTRAVENOUS
  Administered 2020-11-07 (×2): 200 ug via INTRAVENOUS
  Administered 2020-11-07 (×2): 150 ug via INTRAVENOUS

## 2020-11-07 MED ORDER — FAMOTIDINE 20 MG PO TABS
ORAL_TABLET | ORAL | Status: AC
  Administered 2020-11-07: 20 mg via ORAL
  Filled 2020-11-07: qty 1

## 2020-11-07 SURGICAL SUPPLY — 27 items
BAG DRAIN CYSTO-URO LG1000N (MISCELLANEOUS) ×2 IMPLANT
BAG DRN RND TRDRP ANRFLXCHMBR (UROLOGICAL SUPPLIES) ×1
BAG URINE DRAIN 2000ML AR STRL (UROLOGICAL SUPPLIES) ×2 IMPLANT
CATH FOLEY 2WAY  5CC 20FR SIL (CATHETERS) ×2
CATH FOLEY 2WAY 5CC 20FR SIL (CATHETERS) ×1 IMPLANT
CATH FOLEY 2WAY SIL 20X30 (CATHETERS) ×1 IMPLANT
CNTNR SPEC 2.5X3XGRAD LEK (MISCELLANEOUS) ×1
CONT SPEC 4OZ STER OR WHT (MISCELLANEOUS) ×1
CONT SPEC 4OZ STRL OR WHT (MISCELLANEOUS) ×1
CONTAINER SPEC 2.5X3XGRAD LEK (MISCELLANEOUS) IMPLANT
COVER WAND RF STERILE (DRAPES) ×2 IMPLANT
DRSG TELFA 4X3 1S NADH ST (GAUZE/BANDAGES/DRESSINGS) ×2 IMPLANT
ELECT LOOP 22F BIPOLAR SML (ELECTROSURGICAL)
ELECTRODE LOOP 22F BIPOLAR SML (ELECTROSURGICAL) IMPLANT
GLOVE SURG ENC MOIS LTX SZ7.5 (GLOVE) ×2 IMPLANT
GOWN STRL REUS W/ TWL LRG LVL4 (GOWN DISPOSABLE) ×1 IMPLANT
GOWN STRL REUS W/ TWL XL LVL3 (GOWN DISPOSABLE) ×1 IMPLANT
GOWN STRL REUS W/TWL LRG LVL4 (GOWN DISPOSABLE) ×2
GOWN STRL REUS W/TWL XL LVL3 (GOWN DISPOSABLE) ×2
IV NS IRRIG 3000ML ARTHROMATIC (IV SOLUTION) ×6 IMPLANT
KIT TURNOVER CYSTO (KITS) ×2 IMPLANT
LOOP CUT BIPOLAR 24F LRG (ELECTROSURGICAL) ×2 IMPLANT
PACK CYSTO AR (MISCELLANEOUS) ×2 IMPLANT
SET IRRIG Y TYPE TUR BLADDER L (SET/KITS/TRAYS/PACK) ×4 IMPLANT
SYR TOOMEY IRRIG 70ML (MISCELLANEOUS) ×2
SYRINGE TOOMEY IRRIG 70ML (MISCELLANEOUS) ×1 IMPLANT
WATER STERILE IRR 1000ML POUR (IV SOLUTION) ×2 IMPLANT

## 2020-11-07 NOTE — H&P (Signed)
Date of Initial H&P: 10/09/20  History reviewed, patient examined, no change in status, stable for surgery.

## 2020-11-07 NOTE — Anesthesia Postprocedure Evaluation (Signed)
Anesthesia Post Note  Patient: Garrett Woodard  Procedure(s) Performed: TRANSURETHRAL RESECTION OF BLADDER TUMOR (TURBT) WITH MITOMYCIN (N/A )  Patient location during evaluation: PACU Anesthesia Type: General Level of consciousness: awake and alert Pain management: pain level controlled Vital Signs Assessment: post-procedure vital signs reviewed and stable Respiratory status: spontaneous breathing and respiratory function stable Cardiovascular status: stable Anesthetic complications: no   No complications documented.   Last Vitals:  Vitals:   11/07/20 0900 11/07/20 0915  BP: 123/82 122/70  Pulse: 86   Resp: 19   Temp:    SpO2: 100%     Last Pain:  Vitals:   11/07/20 0915  TempSrc:   PainSc: 0-No pain                 Tiandre Teall,Buck K

## 2020-11-07 NOTE — Anesthesia Procedure Notes (Signed)
Procedure Name: Intubation Date/Time: 11/07/2020 7:40 AM Performed by: Allean Found, CRNA Pre-anesthesia Checklist: Patient identified, Patient being monitored, Timeout performed, Emergency Drugs available and Suction available Patient Re-evaluated:Patient Re-evaluated prior to induction Oxygen Delivery Method: Circle system utilized Preoxygenation: Pre-oxygenation with 100% oxygen Induction Type: IV induction Ventilation: Mask ventilation without difficulty Laryngoscope Size: McGraph and 4 Grade View: Grade II Tube type: Oral Tube size: 7.0 mm Number of attempts: 1 Airway Equipment and Method: Stylet and Oral airway Placement Confirmation: ETT inserted through vocal cords under direct vision,  positive ETCO2 and breath sounds checked- equal and bilateral Secured at: 23 cm Tube secured with: Tape Dental Injury: Teeth and Oropharynx as per pre-operative assessment  Future Recommendations: Recommend- induction with short-acting agent, and alternative techniques readily available Comments: Difficult mask due to beard and short mandible.  Required two hands for bagging prior to intubation.

## 2020-11-07 NOTE — Transfer of Care (Signed)
Immediate Anesthesia Transfer of Care Note  Patient: Garrett Woodard  Procedure(s) Performed: TRANSURETHRAL RESECTION OF BLADDER TUMOR (TURBT) WITH MITOMYCIN (N/A )  Patient Location: PACU  Anesthesia Type:General  Level of Consciousness: awake, alert  and oriented  Airway & Oxygen Therapy: Patient Spontanous Breathing and Patient connected to face mask oxygen  Post-op Assessment: Report given to RN and Post -op Vital signs reviewed and stable  Post vital signs: Reviewed and stable  Last Vitals:  Vitals Value Taken Time  BP 135/73 11/07/20 0845  Temp    Pulse 84 11/07/20 0847  Resp 18 11/07/20 0847  SpO2 100 % 11/07/20 0847  Vitals shown include unvalidated device data.  Last Pain:  Vitals:   11/07/20 0604  TempSrc: Temporal  PainSc: 0-No pain         Complications: No complications documented.

## 2020-11-07 NOTE — Anesthesia Preprocedure Evaluation (Signed)
Anesthesia Evaluation  Patient identified by MRN, date of birth, ID band Patient awake    Reviewed: Allergy & Precautions, NPO status , Patient's Chart, lab work & pertinent test results  History of Anesthesia Complications Negative for: history of anesthetic complications  Airway Mallampati: II       Dental   Pulmonary asthma (no inhalers in months) , sleep apnea (nto using CPAP) , Not current smoker, former smoker,           Cardiovascular hypertension, Pt. on medications + CAD and + CABG  (-) Past MI and (-) CHF (-) dysrhythmias (-) Valvular Problems/Murmurs     Neuro/Psych neg Seizures    GI/Hepatic Neg liver ROS, neg GERD  ,  Endo/Other  diabetes, Type 2, Oral Hypoglycemic Agents  Renal/GU Renal InsufficiencyRenal disease     Musculoskeletal   Abdominal   Peds  Hematology   Anesthesia Other Findings   Reproductive/Obstetrics                             Anesthesia Physical Anesthesia Plan  ASA: III  Anesthesia Plan: General   Post-op Pain Management:    Induction: Intravenous  PONV Risk Score and Plan: 2 and Ondansetron and Dexamethasone  Airway Management Planned: Oral ETT  Additional Equipment:   Intra-op Plan:   Post-operative Plan:   Informed Consent: I have reviewed the patients History and Physical, chart, labs and discussed the procedure including the risks, benefits and alternatives for the proposed anesthesia with the patient or authorized representative who has indicated his/her understanding and acceptance.       Plan Discussed with:   Anesthesia Plan Comments:         Anesthesia Quick Evaluation

## 2020-11-07 NOTE — Discharge Instructions (Signed)
Indwelling Urinary Catheter Care, Adult °An indwelling urinary catheter is a thin tube that is put into your bladder. The tube helps to drain pee (urine) out of your body. The tube goes in through your urethra. Your urethra is where pee comes out of your body. Your pee will come out through the catheter, then it will go into a bag (drainage bag). °Take good care of your catheter so it will work well. °How to wear your catheter and bag °Supplies needed °· Sticky tape (adhesive tape) or a leg strap. °· Alcohol wipe or soap and water (if you use tape). °· A clean towel (if you use tape). °· Large overnight bag. °· Smaller bag (leg bag). °Wearing your catheter °Attach your catheter to your leg with tape or a leg strap. °· Make sure the catheter is not pulled tight. °· If a leg strap gets wet, take it off and put on a dry strap. °· If you use tape to hold the bag on your leg: °1. Use an alcohol wipe or soap and water to wash your skin where the tape made it sticky before. °2. Use a clean towel to pat-dry that skin. °3. Use new tape to make the bag stay on your leg. °Wearing your bags °You should have been given a large overnight bag. °· You may wear the overnight bag in the day or night. °· Always have the overnight bag lower than your bladder.  Do not let the bag touch the floor. °· Before you go to sleep, put a clean plastic bag in a wastebasket. Then hang the overnight bag inside the wastebasket. °You should also have a smaller leg bag that fits under your clothes. °· Always wear the leg bag below your knee. °· Do not wear your leg bag at night. °How to care for your skin and catheter °Supplies needed °· A clean washcloth. °· Water and mild soap. °· A clean towel. °Caring for your skin and catheter °· Clean the skin around your catheter every day: °1. Wash your hands with soap and water. °2. Wet a clean washcloth in warm water and mild soap. °3. Clean the skin around your urethra. °§ If you are male: °§ Gently  spread the folds of skin around your vagina (labia). °§ With the washcloth in your other hand, wipe the inner side of your labia on each side. Wipe from front to back. °§ If you are male: °§ Pull back any skin that covers the end of your penis (foreskin). °§ With the washcloth in your other hand, wipe your penis in small circles. Start wiping at the tip of your penis, then move away from the catheter. °§ Move the foreskin back in place, if needed. °4. With your free hand, hold the catheter close to where it goes into your body. °§ Keep holding the catheter during cleaning so it does not get pulled out. °5. With the washcloth in your other hand, clean the catheter. °§ Only wipe downward on the catheter. °§ Do not wipe upward toward your body. Doing this may push germs into your urethra and cause infection. °6. Use a clean towel to pat-dry the catheter and the skin around it. Make sure to wipe off all soap. °7. Wash your hands with soap and water. °· Shower every day. Do not take baths. °· Do not use cream, ointment, or lotion on the area where the catheter goes into your body, unless your doctor tells you to. °· Do not   use powders, sprays, or lotions on your genital area. °· Check your skin around the catheter every day for signs of infection. Check for: °? Redness, swelling, or pain. °? Fluid or blood. °? Warmth. °? Pus or a bad smell.  °  °  °How to empty the bag °Supplies needed °· Rubbing alcohol. °· Gauze pad or cotton ball. °· Tape or a leg strap. °Emptying the bag °Pour the pee out of your bag when it is ?-½ full, or at least 2-3 times a day. Do this for your overnight bag and your leg bag. °1. Wash your hands with soap and water. °2. Separate (detach) the bag from your leg. °3. Hold the bag over the toilet or a clean pail. Keep the bag lower than your hips and bladder. This is so the pee (urine) does not go back into the tube. °4. Open the pour spout. It is at the bottom of the bag. °5. Empty the pee into the  toilet or pail. Do not let the pour spout touch any surface. °6. Put rubbing alcohol on a gauze pad or cotton ball. °7. Use the gauze pad or cotton ball to clean the pour spout. °8. Close the pour spout. °9. Attach the bag to your leg with tape or a leg strap. °10. Wash your hands with soap and water. °Follow instructions for cleaning the drainage bag: °· From the product maker. °· As told by your doctor. °How to change the bag °Supplies needed °· Alcohol wipes. °· A clean bag. °· Tape or a leg strap. °Changing the bag °Replace your bag when it starts to leak, smell bad, or look dirty. °1. Wash your hands with soap and water. °2. Separate the dirty bag from your leg. °3. Pinch the catheter with your fingers so that pee does not spill out. °4. Separate the catheter tube from the bag tube where these tubes connect (at the connection valve). Do not let the tubes touch any surface. °5. Clean the end of the catheter tube with an alcohol wipe. Use a different alcohol wipe to clean the end of the bag tube. °6. Connect the catheter tube to the tube of the clean bag. °7. Attach the clean bag to your leg with tape or a leg strap. Do not make the bag tight on your leg. °8. Wash your hands with soap and water. °General rules °· Never pull on your catheter. Never try to take it out. Doing that can hurt you. °· Always wash your hands before and after you touch your catheter or bag. Use a mild, fragrance-free soap. If you do not have soap and water, use hand sanitizer. °· Always make sure there are no twists or bends (kinks) in the catheter tube. °· Always make sure there are no leaks in the catheter or bag. °· Drink enough fluid to keep your pee pale yellow. °· Do not take baths, swim, or use a hot tub. °· If you are male, wipe from front to back after you poop (have a bowel movement).   °Contact a doctor if: °· Your pee is cloudy. °· Your pee smells worse than usual. °· Your catheter gets clogged. °· Your catheter  leaks. °· Your bladder feels full. °Get help right away if: °· You have redness, swelling, or pain where the catheter goes into your body. °· You have fluid, blood, pus, or a bad smell coming from the area where the catheter goes into your body. °· Your skin feels   warm where the catheter goes into your body. °· You have a fever. °· You have pain in your: °? Belly (abdomen). °? Legs. °? Lower back. °? Bladder. °· You see blood in the catheter. °· Your pee is pink or red. °· You feel sick to your stomach (nauseous). °· You throw up (vomit). °· You have chills. °· Your pee is not draining into the bag. °· Your catheter gets pulled out. °Summary °· An indwelling urinary catheter is a thin tube that is placed into the bladder to help drain pee (urine) out of the body. °· The catheter is placed into the part of the body that drains pee from the bladder (urethra). °· Taking good care of your catheter will keep it working properly and help prevent problems. °· Always wash your hands before and after touching your catheter or bag. °· Never pull on your catheter or try to take it out. °This information is not intended to replace advice given to you by your health care provider. Make sure you discuss any questions you have with your health care provider. °Document Revised: 11/11/2018 Document Reviewed: 03/05/2017 °Elsevier Patient Education © 2021 [employer] ° °

## 2020-11-07 NOTE — Op Note (Signed)
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 Transurethral resection of bladder tumor during 10 mm (CPT 52234)                      2.  Instillation of mitomycin-C into the bladder  Surgeon: Otelia Limes. Yves Dill MD  Anesthesia: General  Indications:See the history and physical. After informed consent the above procedure(s) were requested     Technique and findings: After adequate general anesthesia been obtained patient placed into dorsal lithotomy position and the perineum was prepped and draped in the usual fashion.  The 24 French resectoscope sheath was advanced into the bladder with the obturator in place.  The resectoscope was then coupled with a camera and placed into the sheath.  The bladder was thoroughly inspected.  The right ureteral orifice was identified and had clear efflux.  The left ureteral orifice was covered by a papillary bladder tumor extended laterally to the bladder neck.  The bladder tumor measured approximately 10 mm in greatest length.  No other bladder tumors were identified.  There was a small bladder diverticulum just lateral to the tumor and some papillary fronds involve the rim of the diverticulum but not the inner portion of the diverticulum.  The tumor was resected requiring the resection of the orifice.  Tumor fragments were submitted to pathology.  The neo-- ureteral orifice was patent and had clear efflux.  Any bleeding sites were then cauterized.  The resectoscope was then removed and 10 cc of Xylocaine instilled within the urethra.  20 French silicone catheter was placed and irrigated until clear.  40 mg of mitomycin-C was instilled within the bladder and the catheter was clamped.  A B&O suppository was placed.  Blood loss was minimal.  The procedure was then terminated and patient transferred to the recovery room in stable condition.

## 2020-11-08 ENCOUNTER — Encounter: Payer: Self-pay | Admitting: Urology

## 2020-11-08 LAB — SURGICAL PATHOLOGY

## 2020-11-13 DIAGNOSIS — R310 Gross hematuria: Secondary | ICD-10-CM | POA: Diagnosis not present

## 2020-11-13 DIAGNOSIS — C670 Malignant neoplasm of trigone of bladder: Secondary | ICD-10-CM | POA: Diagnosis not present

## 2020-11-13 DIAGNOSIS — N200 Calculus of kidney: Secondary | ICD-10-CM | POA: Diagnosis not present

## 2020-12-17 DIAGNOSIS — R310 Gross hematuria: Secondary | ICD-10-CM | POA: Diagnosis not present

## 2020-12-17 DIAGNOSIS — C676 Malignant neoplasm of ureteric orifice: Secondary | ICD-10-CM | POA: Diagnosis not present

## 2020-12-18 DIAGNOSIS — C670 Malignant neoplasm of trigone of bladder: Secondary | ICD-10-CM | POA: Diagnosis not present

## 2020-12-18 DIAGNOSIS — R310 Gross hematuria: Secondary | ICD-10-CM | POA: Diagnosis not present

## 2020-12-22 ENCOUNTER — Telehealth: Payer: Self-pay | Admitting: Family Medicine

## 2020-12-22 DIAGNOSIS — K769 Liver disease, unspecified: Secondary | ICD-10-CM

## 2020-12-22 NOTE — Telephone Encounter (Signed)
Please check on patient.  Please see how he is feeling and if he feels up to going through MRI liver.  I have the order signed in this message.  Please let me know.  Thanks.

## 2020-12-23 DIAGNOSIS — C676 Malignant neoplasm of ureteric orifice: Secondary | ICD-10-CM | POA: Diagnosis not present

## 2020-12-23 DIAGNOSIS — R338 Other retention of urine: Secondary | ICD-10-CM | POA: Diagnosis not present

## 2020-12-23 DIAGNOSIS — R310 Gross hematuria: Secondary | ICD-10-CM | POA: Diagnosis not present

## 2020-12-23 NOTE — Telephone Encounter (Signed)
LMTCB

## 2020-12-24 ENCOUNTER — Other Ambulatory Visit: Payer: Self-pay

## 2020-12-24 ENCOUNTER — Inpatient Hospital Stay: Payer: Medicare HMO | Admitting: Certified Registered"

## 2020-12-24 ENCOUNTER — Encounter: Payer: Self-pay | Admitting: Urology

## 2020-12-24 ENCOUNTER — Ambulatory Visit
Admission: EM | Admit: 2020-12-24 | Discharge: 2020-12-24 | Disposition: A | Discharge status: Home / Self Care | Payer: Medicare HMO | Source: Ambulatory Visit | Attending: Urology | Admitting: Urology

## 2020-12-24 ENCOUNTER — Encounter
Admission: EM | Disposition: A | Discharge status: Home / Self Care | Payer: Self-pay | Source: Ambulatory Visit | Attending: Urology

## 2020-12-24 DIAGNOSIS — Z951 Presence of aortocoronary bypass graft: Secondary | ICD-10-CM | POA: Diagnosis not present

## 2020-12-24 DIAGNOSIS — R310 Gross hematuria: Secondary | ICD-10-CM | POA: Diagnosis not present

## 2020-12-24 DIAGNOSIS — R338 Other retention of urine: Secondary | ICD-10-CM | POA: Diagnosis not present

## 2020-12-24 DIAGNOSIS — Z7984 Long term (current) use of oral hypoglycemic drugs: Secondary | ICD-10-CM | POA: Diagnosis not present

## 2020-12-24 DIAGNOSIS — R339 Retention of urine, unspecified: Secondary | ICD-10-CM | POA: Diagnosis not present

## 2020-12-24 DIAGNOSIS — Z79899 Other long term (current) drug therapy: Secondary | ICD-10-CM | POA: Insufficient documentation

## 2020-12-24 DIAGNOSIS — Z87891 Personal history of nicotine dependence: Secondary | ICD-10-CM | POA: Diagnosis not present

## 2020-12-24 DIAGNOSIS — Z7982 Long term (current) use of aspirin: Secondary | ICD-10-CM | POA: Diagnosis not present

## 2020-12-24 DIAGNOSIS — Z8551 Personal history of malignant neoplasm of bladder: Secondary | ICD-10-CM | POA: Insufficient documentation

## 2020-12-24 DIAGNOSIS — N3289 Other specified disorders of bladder: Secondary | ICD-10-CM | POA: Diagnosis not present

## 2020-12-24 HISTORY — PX: CYSTOSCOPY WITH FULGERATION: SHX6638

## 2020-12-24 LAB — GLUCOSE, CAPILLARY
Glucose-Capillary: 209 mg/dL — ABNORMAL HIGH (ref 70–99)
Glucose-Capillary: 197 mg/dL — ABNORMAL HIGH (ref 70–99)
Glucose-Capillary: 162 mg/dL — ABNORMAL HIGH (ref 70–99)

## 2020-12-24 SURGERY — CYSTOSCOPY, WITH BLADDER FULGURATION
Anesthesia: General

## 2020-12-24 MED ORDER — ONDANSETRON HCL 4 MG/2ML IJ SOLN
INTRAMUSCULAR | Status: DC | PRN
  Administered 2020-12-24: 4 mg via INTRAVENOUS

## 2020-12-24 MED ORDER — LIDOCAINE HCL (CARDIAC) PF 100 MG/5ML IV SOSY
PREFILLED_SYRINGE | INTRAVENOUS | Status: DC | PRN
  Administered 2020-12-24: 80 mg via INTRAVENOUS

## 2020-12-24 MED ORDER — CHLORHEXIDINE GLUCONATE 0.12 % MT SOLN: 15.0000 mL | Freq: Once | OROMUCOSAL | Status: AC

## 2020-12-24 MED ORDER — SODIUM CHLORIDE 0.9 % IV SOLN: INTRAVENOUS | Status: DC

## 2020-12-24 MED ORDER — BELLADONNA ALKALOIDS-OPIUM 16.2-60 MG RE SUPP
RECTAL | Status: DC | PRN
  Administered 2020-12-24: 1 via RECTAL

## 2020-12-24 MED ORDER — PROPOFOL 10 MG/ML IV BOLUS
INTRAVENOUS | Status: DC | PRN
  Administered 2020-12-24: 40 mg via INTRAVENOUS
  Administered 2020-12-24: 160 mg via INTRAVENOUS

## 2020-12-24 MED ORDER — PROPOFOL 10 MG/ML IV BOLUS
INTRAVENOUS | Status: AC
  Filled 2020-12-24: qty 40

## 2020-12-24 MED ORDER — FENTANYL CITRATE (PF) 100 MCG/2ML IJ SOLN: 50.0000 ug | Freq: Once | INTRAMUSCULAR | Status: AC

## 2020-12-24 MED ORDER — LIDOCAINE HCL URETHRAL/MUCOSAL 2 % EX GEL
CUTANEOUS | Status: AC
  Filled 2020-12-24: qty 10

## 2020-12-24 MED ORDER — BELLADONNA ALKALOIDS-OPIUM 16.2-60 MG RE SUPP
RECTAL | Status: AC
  Filled 2020-12-24: qty 1

## 2020-12-24 MED ORDER — FENTANYL CITRATE (PF) 100 MCG/2ML IJ SOLN
INTRAMUSCULAR | Status: DC | PRN
  Administered 2020-12-24: 50 ug via INTRAVENOUS
  Administered 2020-12-24 (×2): 25 ug via INTRAVENOUS

## 2020-12-24 MED ORDER — FENTANYL CITRATE (PF) 100 MCG/2ML IJ SOLN
INTRAMUSCULAR | Status: AC
  Filled 2020-12-24: qty 2

## 2020-12-24 MED ORDER — URIBEL 118 MG PO CAPS: 1.0000 | ORAL_CAPSULE | Freq: Four times a day (QID) | ORAL | 3 refills | Status: DC | PRN

## 2020-12-24 MED ORDER — FENTANYL CITRATE (PF) 100 MCG/2ML IJ SOLN
INTRAMUSCULAR | Status: AC
  Administered 2020-12-24: 50 ug via INTRAVENOUS
  Filled 2020-12-24: qty 2

## 2020-12-24 MED ORDER — CHLORHEXIDINE GLUCONATE 0.12 % MT SOLN
OROMUCOSAL | Status: AC
  Administered 2020-12-24: 15 mL via OROMUCOSAL
  Filled 2020-12-24: qty 15

## 2020-12-24 MED ORDER — SODIUM CHLORIDE 0.9 % IV SOLN
1.0000 g | Freq: Once | INTRAVENOUS | Status: AC
  Administered 2020-12-24: 1 g via INTRAVENOUS

## 2020-12-24 MED ORDER — DEXAMETHASONE SODIUM PHOSPHATE 10 MG/ML IJ SOLN
INTRAMUSCULAR | Status: DC | PRN
  Administered 2020-12-24: 5 mg via INTRAVENOUS

## 2020-12-24 MED ORDER — ORAL CARE MOUTH RINSE: 15.0000 mL | Freq: Once | OROMUCOSAL | Status: AC

## 2020-12-24 MED ORDER — SODIUM CHLORIDE 0.9 % IV SOLN
INTRAVENOUS | Status: AC
  Filled 2020-12-24: qty 10

## 2020-12-24 SURGICAL SUPPLY — 29 items
BAG DRAIN CYSTO-URO LG1000N (MISCELLANEOUS) ×2 IMPLANT
BAG DRN RND TRDRP ANRFLXCHMBR (UROLOGICAL SUPPLIES) ×1
BAG URINE DRAIN 2000ML AR STRL (UROLOGICAL SUPPLIES) ×2 IMPLANT
CATH FOLEY 2WAY  5CC 20FR SIL (CATHETERS) ×2
CATH FOLEY 2WAY 5CC 20FR SIL (CATHETERS) ×1 IMPLANT
CATH URETL OPEN END 6X70 (CATHETERS) ×2 IMPLANT
COVER WAND RF STERILE (DRAPES) ×2 IMPLANT
ELECT REM PT RETURN 9FT ADLT (ELECTROSURGICAL) ×2
ELECTRODE REM PT RTRN 9FT ADLT (ELECTROSURGICAL) ×1 IMPLANT
GLOVE SURG ENC MOIS LTX SZ7 (GLOVE) ×4 IMPLANT
GLOVE SURG ENC MOIS LTX SZ7.5 (GLOVE) ×2 IMPLANT
GOWN STRL REUS W/ TWL LRG LVL3 (GOWN DISPOSABLE) ×1 IMPLANT
GOWN STRL REUS W/ TWL XL LVL3 (GOWN DISPOSABLE) ×1 IMPLANT
GOWN STRL REUS W/TWL LRG LVL3 (GOWN DISPOSABLE) ×2
GOWN STRL REUS W/TWL XL LVL3 (GOWN DISPOSABLE) ×2
KIT TURNOVER CYSTO (KITS) ×2 IMPLANT
MANIFOLD NEPTUNE II (INSTRUMENTS) ×2 IMPLANT
NDL SAFETY ECLIPSE 18X1.5 (NEEDLE) ×1 IMPLANT
NEEDLE HYPO 18GX1.5 SHARP (NEEDLE) ×2
PACK CYSTO AR (MISCELLANEOUS) ×2 IMPLANT
PLUG CATH AND CAP STER (CATHETERS) ×2 IMPLANT
SET IRRIG Y TYPE TUR BLADDER L (SET/KITS/TRAYS/PACK) ×2 IMPLANT
SOL PREP PVP 2OZ (MISCELLANEOUS) ×2
SOLUTION PREP PVP 2OZ (MISCELLANEOUS) ×1 IMPLANT
SURGILUBE 2OZ TUBE FLIPTOP (MISCELLANEOUS) ×2 IMPLANT
SYR TOOMEY IRRIG 70ML (MISCELLANEOUS) ×2
SYRINGE TOOMEY IRRIG 70ML (MISCELLANEOUS) ×1 IMPLANT
WATER STERILE IRR 1000ML POUR (IV SOLUTION) ×2 IMPLANT
WATER STERILE IRR 3000ML UROMA (IV SOLUTION) ×2 IMPLANT

## 2020-12-24 NOTE — H&P (Signed)
Date of Initial H&P: 12/24/20  History reviewed, patient examined, no change in status, stable for surgery.

## 2020-12-24 NOTE — Anesthesia Procedure Notes (Signed)
Procedure Name: LMA Insertion Date/Time: 12/24/2020 2:32 PM Performed by: Chanetta Marshall, CRNA Pre-anesthesia Checklist: Patient identified, Emergency Drugs available, Suction available and Patient being monitored Patient Re-evaluated:Patient Re-evaluated prior to induction Oxygen Delivery Method: Circle system utilized Preoxygenation: Pre-oxygenation with 100% oxygen Induction Type: IV induction Ventilation: Mask ventilation without difficulty LMA: LMA inserted LMA Size: 4.0 Tube type: Oral Number of attempts: 1 Placement Confirmation: ETT inserted through vocal cords under direct vision,  positive ETCO2,  breath sounds checked- equal and bilateral and CO2 detector Tube secured with: Tape Dental Injury: Teeth and Oropharynx as per pre-operative assessment

## 2020-12-24 NOTE — Discharge Instructions (Addendum)
Indwelling Urinary Catheter Care, Adult An indwelling urinary catheter is a thin tube that is put into your bladder. The tube helps to drain pee (urine) out of your body. The tube goes in through your urethra. Your urethra is where pee comes out of your body. Your pee will come out through the catheter, then it will go into a bag (drainage bag). Take good care of your catheter so it will work well. How to wear your catheter and bag Supplies needed  Sticky tape (adhesive tape) or a leg strap.  Alcohol wipe or soap and water (if you use tape).  A clean towel (if you use tape).  Large overnight bag.  Smaller bag (leg bag). Wearing your catheter Attach your catheter to your leg with tape or a leg strap.  Make sure the catheter is not pulled tight.  If a leg strap gets wet, take it off and put on a dry strap.  If you use tape to hold the bag on your leg: 1. Use an alcohol wipe or soap and water to wash your skin where the tape made it sticky before. 2. Use a clean towel to pat-dry that skin. 3. Use new tape to make the bag stay on your leg. Wearing your bags You should have been given a large overnight bag.  You may wear the overnight bag in the day or night.  Always have the overnight bag lower than your bladder.  Do not let the bag touch the floor.  Before you go to sleep, put a clean plastic bag in a wastebasket. Then hang the overnight bag inside the wastebasket. You should also have a smaller leg bag that fits under your clothes.  Always wear the leg bag below your knee.  Do not wear your leg bag at night. How to care for your skin and catheter Supplies needed  A clean washcloth.  Water and mild soap.  A clean towel. Caring for your skin and catheter  Clean the skin around your catheter every day: 1. Wash your hands with soap and water. 2. Wet a clean washcloth in warm water and mild soap. 3. Clean the skin around your urethra.  If you are male:  Gently  spread the folds of skin around your vagina (labia).  With the washcloth in your other hand, wipe the inner side of your labia on each side. Wipe from front to back.  If you are male:  Pull back any skin that covers the end of your penis (foreskin).  With the washcloth in your other hand, wipe your penis in small circles. Start wiping at the tip of your penis, then move away from the catheter.  Move the foreskin back in place, if needed. 4. With your free hand, hold the catheter close to where it goes into your body.  Keep holding the catheter during cleaning so it does not get pulled out. 5. With the washcloth in your other hand, clean the catheter.  Only wipe downward on the catheter.  Do not wipe upward toward your body. Doing this may push germs into your urethra and cause infection. 6. Use a clean towel to pat-dry the catheter and the skin around it. Make sure to wipe off all soap. 7. Wash your hands with soap and water.  Shower every day. Do not take baths.  Do not use cream, ointment, or lotion on the area where the catheter goes into your body, unless your doctor tells you to.  Do not   use powders, sprays, or lotions on your genital area.  Check your skin around the catheter every day for signs of infection. Check for: ? Redness, swelling, or pain. ? Fluid or blood. ? Warmth. ? Pus or a bad smell.      How to empty the bag Supplies needed  Rubbing alcohol.  Gauze pad or cotton ball.  Tape or a leg strap. Emptying the bag Pour the pee out of your bag when it is ?- full, or at least 2-3 times a day. Do this for your overnight bag and your leg bag. 1. Wash your hands with soap and water. 2. Separate (detach) the bag from your leg. 3. Hold the bag over the toilet or a clean pail. Keep the bag lower than your hips and bladder. This is so the pee (urine) does not go back into the tube. 4. Open the pour spout. It is at the bottom of the bag. 5. Empty the pee into the  toilet or pail. Do not let the pour spout touch any surface. 6. Put rubbing alcohol on a gauze pad or cotton ball. 7. Use the gauze pad or cotton ball to clean the pour spout. 8. Close the pour spout. 9. Attach the bag to your leg with tape or a leg strap. 10. Wash your hands with soap and water. Follow instructions for cleaning the drainage bag:  From the product maker.  As told by your doctor. How to change the bag Supplies needed  Alcohol wipes.  A clean bag.  Tape or a leg strap. Changing the bag Replace your bag when it starts to leak, smell bad, or look dirty. 1. Wash your hands with soap and water. 2. Separate the dirty bag from your leg. 3. Pinch the catheter with your fingers so that pee does not spill out. 4. Separate the catheter tube from the bag tube where these tubes connect (at the connection valve). Do not let the tubes touch any surface. 5. Clean the end of the catheter tube with an alcohol wipe. Use a different alcohol wipe to clean the end of the bag tube. 6. Connect the catheter tube to the tube of the clean bag. 7. Attach the clean bag to your leg with tape or a leg strap. Do not make the bag tight on your leg. 8. Wash your hands with soap and water. General rules  Never pull on your catheter. Never try to take it out. Doing that can hurt you.  Always wash your hands before and after you touch your catheter or bag. Use a mild, fragrance-free soap. If you do not have soap and water, use hand sanitizer.  Always make sure there are no twists or bends (kinks) in the catheter tube.  Always make sure there are no leaks in the catheter or bag.  Drink enough fluid to keep your pee pale yellow.  Do not take baths, swim, or use a hot tub.  If you are male, wipe from front to back after you poop (have a bowel movement).   Contact a doctor if:  Your pee is cloudy.  Your pee smells worse than usual.  Your catheter gets clogged.  Your catheter  leaks.  Your bladder feels full. Get help right away if:  You have redness, swelling, or pain where the catheter goes into your body.  You have fluid, blood, pus, or a bad smell coming from the area where the catheter goes into your body.  Your skin feels   warm where the catheter goes into your body.  You have a fever.  You have pain in your: ? Belly (abdomen). ? Legs. ? Lower back. ? Bladder.  You see blood in the catheter.  Your pee is pink or red.  You feel sick to your stomach (nauseous).  You throw up (vomit).  You have chills.  Your pee is not draining into the bag.  Your catheter gets pulled out. Summary  An indwelling urinary catheter is a thin tube that is placed into the bladder to help drain pee (urine) out of the body.  The catheter is placed into the part of the body that drains pee from the bladder (urethra).  Taking good care of your catheter will keep it working properly and help prevent problems.  Always wash your hands before and after touching your catheter or bag.  Never pull on your catheter or try to take it out. This information is not intended to replace advice given to you by your health care provider. Make sure you discuss any questions you have with your health care provider. Document Revised: 11/11/2018 Document Reviewed: 03/05/2017 Elsevier Patient Education  2021 [place]. Hematuria, Adult Hematuria is blood in the urine. Blood may be visible in the urine, or it may be identified with a test. This condition can be caused by infections of the bladder, urethra, kidney, or prostate. Other possible causes include:  Kidney stones.  Cancer of the urinary tract.  Too much calcium in the urine.  Conditions that are passed from parent to child (inherited conditions).  Exercise that requires a lot of energy. Infections can usually be treated with medicine, and a kidney stone usually will pass through your urine. If neither of these is  the cause of your hematuria, more tests may be needed to identify the cause of your symptoms. It is very important to tell your health care provider about any blood in your urine, even if it is painless or the blood stops without treatment. Blood in the urine, when it happens and then stops and then happens again, can be a symptom of a very serious condition, including cancer. There is no pain in the initial stages of many urinary cancers. Follow these instructions at home: Medicines  Take over-the-counter and prescription medicines only as told by your health care provider.  If you were prescribed an antibiotic medicine, take it as told by your health care provider. Do not stop taking the antibiotic even if you start to feel better. Eating and drinking  Drink enough fluid to keep your urine pale yellow. It is recommended that you drink 3-4 quarts (2.8-3.8 L) a day. If you have been diagnosed with an infection, drinking cranberry juice in addition to large amounts of water is recommended.  Avoid caffeine, tea, and carbonated beverages. These tend to irritate the bladder.  Avoid alcohol because it may irritate the prostate (in males). General instructions  If you have been diagnosed with a kidney stone, follow your health care provider's instructions about straining your urine to catch the stone.  Empty your bladder often. Avoid holding urine for long periods of time.  If you are male: ? After a bowel movement, wipe from front to back and use each piece of toilet paper only once. ? Empty your bladder before and after sex.  Pay attention to any changes in your symptoms. Tell your health care provider about any changes or any new symptoms.  It is up to you to get  the results of any tests. Ask your health care provider, or the department that is doing the test, when your results will be ready.  Keep all follow-up visits. This is important. Contact a health care provider if:  You develop  back pain.  You have a fever or chills.  You have nausea or vomiting.  Your symptoms do not improve after 3 days.  Your symptoms get worse. Get help right away if:  You develop severe vomiting and are unable to take medicine without vomiting.  You develop severe pain in your back or abdomen even though you are taking medicine.  You pass a large amount of blood in your urine.  You pass blood clots in your urine.  You feel very weak or like you might faint.  You faint. Summary  Hematuria is blood in the urine. It has many possible causes.  It is very important that you tell your health care provider about any blood in your urine, even if it is painless or the blood stops without treatment.  Take over-the-counter and prescription medicines only as told by your health care provider.  Drink enough fluid to keep your urine pale yellow. This information is not intended to replace advice given to you by your health care provider. Make sure you discuss any questions you have with your health care provider. Document Revised: 03/20/2020 Document Reviewed: 03/20/2020 Elsevier Patient Education  2021 [place]   1) The drugs that you were given will stay in your system until tomorrow so for the next 24 hours you should not:  A) Drive an automobile B) Make any legal decisions C) Drink any alcoholic beverage   2) You may resume regular meals tomorrow.  Today it is better to start with liquids and gradually work up to solid foods.  You may eat anything you prefer, but it is better to start with liquids, then soup and crackers, and gradually work up to solid foods.   3) Please notify your doctor immediately if you have any unusual bleeding, trouble breathing, redness and pain at the surgery site, drainage, fever, or pain not relieved by medication.    4) Additional Instructions:        Please contact your physician with  any problems or Same Day Surgery at [phone], Monday through Friday 6 am to 4 pm, or [place] at [facility] number at [phone].

## 2020-12-24 NOTE — Op Note (Signed)
Preoperative diagnosis: 1.  Clot urinary retention (N32.89)                                             2.  Gross hematuria (R31.0)                                             3.  Recent history of bladder cancer (Z85.51)  Postoperative diagnosis: Same  Procedure: 1.  Cystoscopy with fulguration (CPT [id number])                      2.  Clot evacuation (CPT [id number])  Surgeon: Otelia Limes. Yves Dill MD  Anesthesia: General  Indications:See the history and physical. After informed consent the above procedure(s) were requested     Technique and findings: After adequate general anesthesia been obtained patient was placed into dorsal lithotomy position and the perineum was prepped and draped in the usual fashion.  The 24 [place] resectoscop was coupled to the camera and visually advanced into the bladder.  Approximately 300 cc of clot was evacuated from the bladder with the [facility] syringe.  The bladder was then thoroughly inspected.  There were 3 hemorrhagic areas with slow blood oozing on the posterior wall and lateral walls.  The previously noted tumor resection site did not appear to be bleeding.  The areas of the bladder that were hemorrhagic were cauterized with the loop.  The resectoscope removed and 10 cc of viscous Xylocaine instilled within the bladder.  The catheter was irrigated until clear.  A B&O suppository was placed.  Blood loss was minimal.  The procedure was then terminated and patient transferred to the recovery room in stable condition.

## 2020-12-24 NOTE — H&P (View-Only) (Signed)
Date of Initial H&P: 12/24/20  History reviewed, patient examined, no change in status, stable for surgery.

## 2020-12-24 NOTE — Anesthesia Postprocedure Evaluation (Signed)
Anesthesia Post Note  Patient: Alfredia Client  Procedure(s) Performed: CYSTOSCOPY WITH FULGERATION (N/A )  Patient location during evaluation: PACU Anesthesia Type: General Level of consciousness: awake and alert Pain management: pain level controlled Vital Signs Assessment: post-procedure vital signs reviewed and stable Respiratory status: spontaneous breathing, nonlabored ventilation, respiratory function stable and patient connected to nasal cannula oxygen Cardiovascular status: blood pressure returned to baseline and stable Postop Assessment: no apparent nausea or vomiting Anesthetic complications: no   No complications documented.   Last Vitals:  Vitals:   12/24/20 1600 12/24/20 1621  BP: (!) 125/56 (!) 141/66  Pulse: 80 77  Resp: (!) 9 18  Temp: 37.1 C (!) 36.3 C  SpO2: 91% 100%    Last Pain:  Vitals:   12/24/20 1621  TempSrc: Temporal  PainSc: 0-No pain                 Precious Haws Federica Allport

## 2020-12-24 NOTE — Transfer of Care (Signed)
Immediate Anesthesia Transfer of Care Note  Patient: Garrett Woodard  Procedure(s) Performed: [place] (N/A )  Patient Location: PACU  Anesthesia Type:General  Level of Consciousness: awake, alert  and oriented  Airway & Oxygen Therapy: Patient Spontanous Breathing and Patient connected to face mask oxygen  Post-op Assessment: Report given to RN and Post -op Vital signs reviewed and stable  Post vital signs: Reviewed and stable  Last Vitals:  Vitals Value Taken Time  BP    Temp    Pulse    Resp    SpO2      Last Pain:  Vitals:   12/24/20 1355  TempSrc:   PainSc: 7          Complications: No complications documented.

## 2020-12-24 NOTE — H&P (View-Only) (Signed)
NAME: Garrett Woodard, Garrett Woodard MEDICAL RECORD NO: [id number] ACCOUNT NO: [id number] DATE OF BIRTH: 1951/07/27 FACILITY: [facility] LOCATION: [facility] PHYSICIAN: Otelia Limes. Yves Dill, MD  History and Physical   DATE OF ADMISSION: 12/24/2020  The patient is scheduled for urgent surgery today.  CHIEF COMPLAINT:  Clot retention.  HISTORY OF PRESENT ILLNESS:  The patient is a 70 year old white male who developed clot urinary retention yesterday.  A catheter was placed and irrigated until clear, but then he developed recurrent clot retention today.  He comes in now for cystoscopy  with clot evacuation and fulguration.  He is status post transurethral resection of bladder tumor involving the left ureteral orifice on 04/07.  He apparently had been doing some heavy yard work prior to the initial bleeding episode.  The pathology  report revealed noninvasive low-grade papillary transitional cell carcinoma.  Hematocrit yesterday was 35.6%.  PAST MEDICAL HISTORY:  ALLERGIES:  No drug allergies.  CURRENT MEDICATIONS:  Metformin, aspirin, lovastatin, metoprolol, and tamsulosin.  PAST SURGICAL HISTORY:  Coronary artery bypass graft x3 in 2005.  PAST AND CURRENT MEDICAL CONDITIONS:    1.  Hypertension. 2.  Hypercholesterolemia. 3.  Diabetes. 4.  Coronary artery disease. 5.  History of kidney stones.  REVIEW OF SYSTEMS:  He denied chest pain, shortness of breath, or stroke.  SOCIAL HISTORY:  The patient quit smoking in 2005 with a 5-pack-year history.  He consumes 7 alcoholic beverages per week.  FAMILY HISTORY:  Father died at age 68 of heart disease.  Mother died at age 55 of a stroke.  PHYSICAL EXAMINATION:  VITAL SIGNS:  Height 6 feet 1 inch, weight 216 pounds.  GENERAL:  Well-nourished white male in no acute distress.  HEENT:  Sclerae were clear.  Pupils were equally round, reactive to light and accommodation.  Extraocular movements were intact.  NECK:  No palpable neck masses.  No audible carotid  bruits. LYMPHATIC:  No palpable cervical or inguinal adenopathy.  PULMONARY:  Lungs clear to auscultation.  CARDIOVASCULAR:  Regular rhythm and rate without audible murmurs.  ABDOMEN:  Soft, nontender abdomen.  No CVA tenderness. GENITOURINARY:  The patient was uncircumcised.  Testes were smooth, nontender, 20 mL in size each. RECTAL EXAM: 35 grams, smooth, nontender prostate.  NEUROMUSCULAR:  Alert and oriented x3.  IMPRESSION:    1.  Clot urinary retention with gross hematuria. 2.  Recent history of superficial bladder cancer, status post TURBT on 04/07.  PLAN:  Cystoscopy with clot evacuation and fulguration.   [facility] D: 12/24/2020 10:50:05 am T: 12/24/2020 1:05:00 pm  JOB: [id number]/ [id number]

## 2020-12-24 NOTE — H&P (Signed)
NAME: Garrett Woodard, Garrett Woodard MEDICAL RECORD NO: [id number] ACCOUNT NO: [id number] DATE OF BIRTH: [birth year] FACILITY: [facility] LOCATION: [facility] PHYSICIAN: Otelia Limes. Yves Dill, MD  History and Physical   DATE OF ADMISSION: 12/24/2020  The patient is scheduled for urgent surgery today.  CHIEF COMPLAINT:  Clot retention.  HISTORY OF PRESENT ILLNESS:  The patient is a 70 year old white male who developed clot urinary retention yesterday.  A catheter was placed and irrigated until clear, but then he developed recurrent clot retention today.  He comes in now for cystoscopy  with clot evacuation and fulguration.  He is status post transurethral resection of bladder tumor involving the left ureteral orifice on 04/07.  He apparently had been doing some heavy yard work prior to the initial bleeding episode.  The pathology  report revealed noninvasive low-grade papillary transitional cell carcinoma.  Hematocrit yesterday was 35.6%.  PAST MEDICAL HISTORY:  ALLERGIES:  No drug allergies.  CURRENT MEDICATIONS:  Metformin, aspirin, lovastatin, metoprolol, and tamsulosin.  PAST SURGICAL HISTORY:  Coronary artery bypass graft x3 in 2005.  PAST AND CURRENT MEDICAL CONDITIONS:    1.  Hypertension. 2.  Hypercholesterolemia. 3.  Diabetes. 4.  Coronary artery disease. 5.  History of kidney stones.  REVIEW OF SYSTEMS:  He denied chest pain, shortness of breath, or stroke.  SOCIAL HISTORY:  The patient quit smoking in 2005 with a 5-pack-year history.  He consumes 7 alcoholic beverages per week.  FAMILY HISTORY:  Father died at age 45 of heart disease.  Mother died at age 42 of a stroke.  PHYSICAL EXAMINATION:  VITAL SIGNS:  Height 6 feet 1 inch, weight 216 pounds.  GENERAL:  Well-nourished white male in no acute distress.  HEENT:  Sclerae were clear.  Pupils were equally round, reactive to light and accommodation.  Extraocular movements were intact.  NECK:  No palpable neck masses.  No audible carotid  bruits. LYMPHATIC:  No palpable cervical or inguinal adenopathy.  PULMONARY:  Lungs clear to auscultation.  CARDIOVASCULAR:  Regular rhythm and rate without audible murmurs.  ABDOMEN:  Soft, nontender abdomen.  No CVA tenderness. GENITOURINARY:  The patient was uncircumcised.  Testes were smooth, nontender, 20 mL in size each. RECTAL EXAM: 35 grams, smooth, nontender prostate.  NEUROMUSCULAR:  Alert and oriented x3.  IMPRESSION:    1.  Clot urinary retention with gross hematuria. 2.  Recent history of superficial bladder cancer, status post TURBT on 04/07.  PLAN:  Cystoscopy with clot evacuation and fulguration.   [facility] D: 12/24/2020 10:50:05 am T: 12/24/2020 1:05:00 pm  JOB: [id number]/ [id number]

## 2020-12-24 NOTE — Anesthesia Preprocedure Evaluation (Signed)
Anesthesia Evaluation  Patient identified by MRN, date of birth, ID band Patient awake    Reviewed: Allergy & Precautions, NPO status , Patient's Chart, lab work & pertinent test results  History of Anesthesia Complications Negative for: history of anesthetic complications  Airway Mallampati: III  TM Distance: >3 FB Neck ROM: Full    Dental no notable dental hx.    Pulmonary asthma (no inhalers in months) , sleep apnea (nto using CPAP) , Not current smoker, former smoker,    Pulmonary exam normal        Cardiovascular hypertension, Pt. on medications + CAD and + CABG  (-) Past MI and (-) CHF Normal cardiovascular exam(-) dysrhythmias (-) Valvular Problems/Murmurs     Neuro/Psych neg Seizures    GI/Hepatic Neg liver ROS, neg GERD  ,  Endo/Other  diabetes, Type 2, Oral Hypoglycemic Agents  Renal/GU Renal InsufficiencyRenal disease Bladder dysfunction      Musculoskeletal   Abdominal   Peds  Hematology   Anesthesia Other Findings Allergy    Asthma  cats  Coronary artery disease 10/04/2003 Cabg atrial septal defect repair//[facility]) EF 70%, 3 vessell dz. (Dr.Callwood)10/04/2003  Diabetes mellitus  type 2  Hyperlipidemia    Hypertension    Renal disorder       Reproductive/Obstetrics                             Anesthesia Physical  Anesthesia Plan  ASA: III  Anesthesia Plan: General   Post-op Pain Management:    Induction: Intravenous  PONV Risk Score and Plan: 2 and Ondansetron and Dexamethasone  Airway Management Planned: LMA  Additional Equipment:   Intra-op Plan:   Post-operative Plan: Extubation in OR  Informed Consent: I have reviewed the patients History and Physical, chart, labs and discussed the procedure including the risks, benefits and alternatives for the proposed anesthesia with the patient or authorized representative who has indicated his/her understanding  and acceptance.       Plan Discussed with: CRNA, Anesthesiologist and Surgeon  Anesthesia Plan Comments:         Anesthesia Quick Evaluation

## 2020-12-25 ENCOUNTER — Encounter: Payer: Self-pay | Admitting: Urology

## 2020-12-26 DIAGNOSIS — R310 Gross hematuria: Secondary | ICD-10-CM | POA: Diagnosis not present

## 2020-12-26 DIAGNOSIS — R338 Other retention of urine: Secondary | ICD-10-CM | POA: Diagnosis not present

## 2020-12-27 ENCOUNTER — Inpatient Hospital Stay: Payer: Medicare HMO | Admitting: Anesthesiology

## 2020-12-27 ENCOUNTER — Observation Stay
Admission: RE | Admit: 2020-12-27 | Discharge: 2020-12-28 | Disposition: A | Discharge status: Home / Self Care | Payer: Medicare HMO | Attending: Urology | Admitting: Urology

## 2020-12-27 ENCOUNTER — Encounter
Admission: RE | Disposition: A | Discharge status: Home / Self Care | Payer: Self-pay | Source: Home / Self Care | Attending: Urology

## 2020-12-27 ENCOUNTER — Encounter: Payer: Self-pay | Admitting: Urology

## 2020-12-27 DIAGNOSIS — E1169 Type 2 diabetes mellitus with other specified complication: Secondary | ICD-10-CM

## 2020-12-27 DIAGNOSIS — Z87891 Personal history of nicotine dependence: Secondary | ICD-10-CM | POA: Diagnosis not present

## 2020-12-27 DIAGNOSIS — Z7984 Long term (current) use of oral hypoglycemic drugs: Secondary | ICD-10-CM | POA: Insufficient documentation

## 2020-12-27 DIAGNOSIS — N3091 Cystitis, unspecified with hematuria: Secondary | ICD-10-CM | POA: Diagnosis present

## 2020-12-27 DIAGNOSIS — Z79899 Other long term (current) drug therapy: Secondary | ICD-10-CM | POA: Insufficient documentation

## 2020-12-27 DIAGNOSIS — E1129 Type 2 diabetes mellitus with other diabetic kidney complication: Secondary | ICD-10-CM

## 2020-12-27 DIAGNOSIS — N3001 Acute cystitis with hematuria: Secondary | ICD-10-CM | POA: Diagnosis not present

## 2020-12-27 DIAGNOSIS — E7800 Pure hypercholesterolemia, unspecified: Secondary | ICD-10-CM | POA: Diagnosis present

## 2020-12-27 DIAGNOSIS — D62 Acute posthemorrhagic anemia: Secondary | ICD-10-CM

## 2020-12-27 DIAGNOSIS — Z8551 Personal history of malignant neoplasm of bladder: Secondary | ICD-10-CM | POA: Insufficient documentation

## 2020-12-27 DIAGNOSIS — N3289 Other specified disorders of bladder: Secondary | ICD-10-CM | POA: Diagnosis not present

## 2020-12-27 DIAGNOSIS — R339 Retention of urine, unspecified: Principal | ICD-10-CM | POA: Insufficient documentation

## 2020-12-27 DIAGNOSIS — Z20822 Contact with and (suspected) exposure to covid-19: Secondary | ICD-10-CM | POA: Insufficient documentation

## 2020-12-27 DIAGNOSIS — J45909 Unspecified asthma, uncomplicated: Secondary | ICD-10-CM | POA: Insufficient documentation

## 2020-12-27 DIAGNOSIS — E119 Type 2 diabetes mellitus without complications: Secondary | ICD-10-CM

## 2020-12-27 DIAGNOSIS — I2510 Atherosclerotic heart disease of native coronary artery without angina pectoris: Secondary | ICD-10-CM | POA: Insufficient documentation

## 2020-12-27 DIAGNOSIS — I10 Essential (primary) hypertension: Secondary | ICD-10-CM | POA: Diagnosis not present

## 2020-12-27 DIAGNOSIS — E785 Hyperlipidemia, unspecified: Secondary | ICD-10-CM | POA: Diagnosis not present

## 2020-12-27 HISTORY — PX: CYSTOSCOPY WITH FULGERATION: SHX6638

## 2020-12-27 LAB — GLUCOSE, CAPILLARY
Glucose-Capillary: 147 mg/dL — ABNORMAL HIGH (ref 70–99)
Glucose-Capillary: 180 mg/dL — ABNORMAL HIGH (ref 70–99)
Glucose-Capillary: 288 mg/dL — ABNORMAL HIGH (ref 70–99)

## 2020-12-27 LAB — RESP PANEL BY RT-PCR (FLU A&B, COVID) ARPGX2
Influenza A by PCR: NEGATIVE
Influenza B by PCR: NEGATIVE
SARS Coronavirus 2 by RT PCR: NEGATIVE

## 2020-12-27 LAB — CBC
HCT: 21.4 % — ABNORMAL LOW (ref 39.0–52.0)
Hemoglobin: 7.3 g/dL — ABNORMAL LOW (ref 13.0–17.0)
MCH: 31.6 pg (ref 26.0–34.0)
MCHC: 34.1 g/dL (ref 30.0–36.0)
MCV: 92.6 fL (ref 80.0–100.0)
Platelets: 293 10*3/uL (ref 150–400)
RBC: 2.31 MIL/uL — ABNORMAL LOW (ref 4.22–5.81)
RDW: 13 % (ref 11.5–15.5)
WBC: 11.2 10*3/uL — ABNORMAL HIGH (ref 4.0–10.5)
nRBC: 0 % (ref 0.0–0.2)

## 2020-12-27 LAB — ABO/RH: ABO/RH(D): A POS

## 2020-12-27 LAB — PREPARE RBC (CROSSMATCH)

## 2020-12-27 SURGERY — CYSTOSCOPY, WITH BLADDER FULGURATION
Anesthesia: General

## 2020-12-27 MED ORDER — ACETAMINOPHEN 10 MG/ML IV SOLN
INTRAVENOUS | Status: AC
  Filled 2020-12-27: qty 100

## 2020-12-27 MED ORDER — OXYCODONE-ACETAMINOPHEN 7.5-325 MG PO TABS
1.0000 | ORAL_TABLET | ORAL | Status: DC | PRN
  Administered 2020-12-27 – 2020-12-28 (×3): 1 via ORAL
  Filled 2020-12-27 (×3): qty 1

## 2020-12-27 MED ORDER — FENTANYL CITRATE (PF) 100 MCG/2ML IJ SOLN
INTRAMUSCULAR | Status: DC | PRN
  Administered 2020-12-27 (×2): 50 ug via INTRAVENOUS

## 2020-12-27 MED ORDER — FAMOTIDINE 20 MG PO TABS
ORAL_TABLET | ORAL | Status: AC
  Administered 2020-12-27: 20 mg
  Filled 2020-12-27: qty 1

## 2020-12-27 MED ORDER — ONDANSETRON HCL 4 MG/2ML IJ SOLN: 4.0000 mg | Freq: Once | INTRAMUSCULAR | Status: DC | PRN

## 2020-12-27 MED ORDER — FENTANYL CITRATE (PF) 100 MCG/2ML IJ SOLN
25.0000 ug | INTRAMUSCULAR | Status: DC | PRN
  Administered 2020-12-27: 25 ug via INTRAVENOUS

## 2020-12-27 MED ORDER — HYOSCYAMINE SULFATE 0.125 MG/ML PO SOLN
0.2500 mg | ORAL | Status: DC | PRN
  Filled 2020-12-27: qty 2

## 2020-12-27 MED ORDER — SODIUM CHLORIDE 0.9 % IR SOLN
3000.0000 mL | Status: DC
  Administered 2020-12-28 (×2): 3000 mL

## 2020-12-27 MED ORDER — LEVOFLOXACIN IN D5W 500 MG/100ML IV SOLN
500.0000 mg | Freq: Once | INTRAVENOUS | Status: AC
  Administered 2020-12-27: 500 mg via INTRAVENOUS

## 2020-12-27 MED ORDER — METOPROLOL TARTRATE 25 MG PO TABS
12.5000 mg | ORAL_TABLET | Freq: Every day | ORAL | Status: DC
  Administered 2020-12-28: 12.5 mg via ORAL
  Filled 2020-12-27: qty 1

## 2020-12-27 MED ORDER — PHENYLEPHRINE HCL (PRESSORS) 10 MG/ML IV SOLN
INTRAVENOUS | Status: DC | PRN
  Administered 2020-12-27: 100 ug via INTRAVENOUS

## 2020-12-27 MED ORDER — SEVOFLURANE IN SOLN
RESPIRATORY_TRACT | Status: AC
  Filled 2020-12-27: qty 250

## 2020-12-27 MED ORDER — OXYCODONE HCL 5 MG/5ML PO SOLN: 5.0000 mg | Freq: Once | ORAL | Status: AC | PRN

## 2020-12-27 MED ORDER — DEXAMETHASONE SODIUM PHOSPHATE 10 MG/ML IJ SOLN
INTRAMUSCULAR | Status: DC | PRN
  Administered 2020-12-27: 10 mg via INTRAVENOUS

## 2020-12-27 MED ORDER — CHLORHEXIDINE GLUCONATE 0.12 % MT SOLN
OROMUCOSAL | Status: AC
  Filled 2020-12-27: qty 15

## 2020-12-27 MED ORDER — ACETAMINOPHEN 10 MG/ML IV SOLN
INTRAVENOUS | Status: DC | PRN
  Administered 2020-12-27: 1000 mg via INTRAVENOUS

## 2020-12-27 MED ORDER — FENTANYL CITRATE (PF) 100 MCG/2ML IJ SOLN
INTRAMUSCULAR | Status: AC
  Filled 2020-12-27: qty 2

## 2020-12-27 MED ORDER — LEVOFLOXACIN IN D5W 500 MG/100ML IV SOLN
INTRAVENOUS | Status: AC
  Filled 2020-12-27: qty 100

## 2020-12-27 MED ORDER — ROSUVASTATIN CALCIUM 20 MG PO TABS
20.0000 mg | ORAL_TABLET | Freq: Every day | ORAL | Status: DC
  Administered 2020-12-28: 20 mg via ORAL
  Filled 2020-12-27: qty 1
  Filled 2020-12-27: qty 2

## 2020-12-27 MED ORDER — LIDOCAINE HCL URETHRAL/MUCOSAL 2 % EX GEL
CUTANEOUS | Status: DC | PRN
  Administered 2020-12-27: 1

## 2020-12-27 MED ORDER — FAMOTIDINE 20 MG PO TABS: 20.0000 mg | ORAL_TABLET | Freq: Once | ORAL | Status: AC

## 2020-12-27 MED ORDER — ONDANSETRON HCL 4 MG/2ML IJ SOLN
INTRAMUSCULAR | Status: DC | PRN
  Administered 2020-12-27: 4 mg via INTRAVENOUS

## 2020-12-27 MED ORDER — GLYCOPYRROLATE 0.2 MG/ML IJ SOLN
INTRAMUSCULAR | Status: DC | PRN
  Administered 2020-12-27: .2 mg via INTRAVENOUS

## 2020-12-27 MED ORDER — TAMSULOSIN HCL 0.4 MG PO CAPS
0.4000 mg | ORAL_CAPSULE | Freq: Every day | ORAL | Status: DC
  Administered 2020-12-28: 0.4 mg via ORAL
  Filled 2020-12-27 (×2): qty 1

## 2020-12-27 MED ORDER — BELLADONNA ALKALOIDS-OPIUM 16.2-60 MG RE SUPP
RECTAL | Status: DC | PRN
  Administered 2020-12-27: 1 via RECTAL

## 2020-12-27 MED ORDER — LIDOCAINE HCL (CARDIAC) PF 100 MG/5ML IV SOSY
PREFILLED_SYRINGE | INTRAVENOUS | Status: DC | PRN
  Administered 2020-12-27: 100 mg via INTRAVENOUS

## 2020-12-27 MED ORDER — SODIUM CHLORIDE 0.9 % IV SOLN: INTRAVENOUS | Status: DC

## 2020-12-27 MED ORDER — BELLADONNA ALKALOIDS-OPIUM 16.2-60 MG RE SUPP
RECTAL | Status: AC
  Filled 2020-12-27: qty 1

## 2020-12-27 MED ORDER — SODIUM CHLORIDE 0.9 % IV SOLN
INTRAVENOUS | Status: DC | PRN
  Administered 2020-12-27: 25 ug/min via INTRAVENOUS

## 2020-12-27 MED ORDER — ACETAMINOPHEN 10 MG/ML IV SOLN: 1000.0000 mg | Freq: Once | INTRAVENOUS | Status: DC | PRN

## 2020-12-27 MED ORDER — INSULIN ASPART 100 UNIT/ML IJ SOLN: 0.0000 [IU] | Freq: Three times a day (TID) | INTRAMUSCULAR | Status: DC

## 2020-12-27 MED ORDER — MIDAZOLAM HCL 2 MG/2ML IJ SOLN
INTRAMUSCULAR | Status: AC
  Filled 2020-12-27: qty 2

## 2020-12-27 MED ORDER — LIDOCAINE HCL URETHRAL/MUCOSAL 2 % EX GEL
CUTANEOUS | Status: AC
  Filled 2020-12-27: qty 10

## 2020-12-27 MED ORDER — OXYCODONE HCL 5 MG PO TABS
ORAL_TABLET | ORAL | Status: AC
  Administered 2020-12-27: 5 mg via ORAL
  Filled 2020-12-27: qty 1

## 2020-12-27 MED ORDER — SODIUM CHLORIDE 0.9% IV SOLUTION: Freq: Once | INTRAVENOUS | Status: DC

## 2020-12-27 MED ORDER — SODIUM CHLORIDE 0.9 % IR SOLN: 3000.0000 mL | Status: DC

## 2020-12-27 MED ORDER — ORAL CARE MOUTH RINSE: 15.0000 mL | Freq: Once | OROMUCOSAL | Status: AC

## 2020-12-27 MED ORDER — ROCURONIUM BROMIDE 100 MG/10ML IV SOLN
INTRAVENOUS | Status: DC | PRN
  Administered 2020-12-27: 10 mg via INTRAVENOUS
  Administered 2020-12-27: 50 mg via INTRAVENOUS

## 2020-12-27 MED ORDER — PROPOFOL 10 MG/ML IV BOLUS
INTRAVENOUS | Status: DC | PRN
  Administered 2020-12-27: 150 mg via INTRAVENOUS

## 2020-12-27 MED ORDER — SUCCINYLCHOLINE CHLORIDE 20 MG/ML IJ SOLN
INTRAMUSCULAR | Status: DC | PRN
  Administered 2020-12-27: 120 mg via INTRAVENOUS

## 2020-12-27 MED ORDER — SUGAMMADEX SODIUM 500 MG/5ML IV SOLN
INTRAVENOUS | Status: DC | PRN
  Administered 2020-12-27: 200 mg via INTRAVENOUS

## 2020-12-27 MED ORDER — OXYCODONE HCL 5 MG PO TABS: 5.0000 mg | ORAL_TABLET | Freq: Once | ORAL | Status: AC | PRN

## 2020-12-27 MED ORDER — CHLORHEXIDINE GLUCONATE 0.12 % MT SOLN
15.0000 mL | Freq: Once | OROMUCOSAL | Status: AC
  Administered 2020-12-27: 15 mL via OROMUCOSAL

## 2020-12-27 MED ORDER — PROPOFOL 10 MG/ML IV BOLUS
INTRAVENOUS | Status: AC
  Filled 2020-12-27: qty 20

## 2020-12-27 MED ORDER — ROCURONIUM BROMIDE 10 MG/ML (PF) SYRINGE
PREFILLED_SYRINGE | INTRAVENOUS | Status: AC
  Filled 2020-12-27: qty 10

## 2020-12-27 SURGICAL SUPPLY — 32 items
BAG DRAIN CYSTO-URO LG1000N (MISCELLANEOUS) ×2 IMPLANT
BAG URINE DRAIN 2000ML AR STRL (UROLOGICAL SUPPLIES) ×2 IMPLANT
CATH FOLEY 2WAY  5CC 20FR SIL (CATHETERS) ×2
CATH FOLEY 2WAY 5CC 20FR SIL (CATHETERS) ×1 IMPLANT
CATH FOLEY 3WAY 30CC 24FR (CATHETERS) ×2
CATH URETL OPEN END 6X70 (CATHETERS) ×2 IMPLANT
CATH URTH STD 24FR FL 3W 2 (CATHETERS) ×1 IMPLANT
COVER WAND RF STERILE (DRAPES) ×2 IMPLANT
ELECT COAG MONO 22-24F ROLLER (MISCELLANEOUS) ×2
ELECT REM PT RETURN 9FT ADLT (ELECTROSURGICAL) ×2
ELECTRODE COAG MONO 22-24F RLR (MISCELLANEOUS) ×1 IMPLANT
ELECTRODE REM PT RTRN 9FT ADLT (ELECTROSURGICAL) ×1 IMPLANT
GLOVE SURG ENC MOIS LTX SZ7 (GLOVE) ×4 IMPLANT
GLOVE SURG ENC MOIS LTX SZ7.5 (GLOVE) ×2 IMPLANT
GOWN STRL REUS W/ TWL LRG LVL3 (GOWN DISPOSABLE) ×1 IMPLANT
GOWN STRL REUS W/ TWL XL LVL3 (GOWN DISPOSABLE) ×1 IMPLANT
GOWN STRL REUS W/TWL LRG LVL3 (GOWN DISPOSABLE) ×2
GOWN STRL REUS W/TWL XL LVL3 (GOWN DISPOSABLE) ×2
HOLDER FOLEY CATH W/STRAP (MISCELLANEOUS) ×2 IMPLANT
KIT TURNOVER CYSTO (KITS) ×2 IMPLANT
NDL SAFETY ECLIPSE 18X1.5 (NEEDLE) ×1 IMPLANT
NEEDLE HYPO 18GX1.5 SHARP (NEEDLE) ×2
PACK CYSTO AR (MISCELLANEOUS) ×2 IMPLANT
PLUG CATH AND CAP STER (CATHETERS) ×2 IMPLANT
SET IRRIG Y TYPE TUR BLADDER L (SET/KITS/TRAYS/PACK) ×2 IMPLANT
SOL PREP PVP 2OZ (MISCELLANEOUS) ×2
SOLUTION PREP PVP 2OZ (MISCELLANEOUS) ×1 IMPLANT
SURGILUBE 2OZ TUBE FLIPTOP (MISCELLANEOUS) ×2 IMPLANT
SYR TOOMEY IRRIG 70ML (MISCELLANEOUS) ×2
SYRINGE TOOMEY IRRIG 70ML (MISCELLANEOUS) ×1 IMPLANT
WATER STERILE IRR 1000ML POUR (IV SOLUTION) ×2 IMPLANT
WATER STERILE IRR 3000ML UROMA (IV SOLUTION) ×2 IMPLANT

## 2020-12-27 NOTE — Anesthesia Procedure Notes (Addendum)
Procedure Name: Intubation Performed by: Kelton Pillar, CRNA Pre-anesthesia Checklist: Patient identified, Emergency Drugs available, Suction available and Patient being monitored Patient Re-evaluated:Patient Re-evaluated prior to induction Oxygen Delivery Method: Circle system utilized Preoxygenation: Pre-oxygenation with 100% oxygen Induction Type: IV induction, Rapid sequence and Cricoid Pressure applied Ventilation: Mask ventilation without difficulty Laryngoscope Size: McGraph and 3 Grade View: Grade I Tube type: Oral Tube size: 7.0 mm Number of attempts: 1 Airway Equipment and Method: Stylet and Oral airway Placement Confirmation: ETT inserted through vocal cords under direct vision,  positive ETCO2,  breath sounds checked- equal and bilateral and CO2 detector Secured at: 21 cm Tube secured with: Tape Dental Injury: Teeth and Oropharynx as per pre-operative assessment

## 2020-12-27 NOTE — Anesthesia Preprocedure Evaluation (Signed)
Anesthesia Evaluation  Patient identified by MRN, date of birth, ID band Patient awake  General Assessment Comment: Acute urinary blood loss and urinary retention, Hgb of 7.3 Last ate tacos at 12pm  Reviewed: Allergy & Precautions, NPO status , Patient's Chart, lab work & pertinent test results  History of Anesthesia Complications Negative for: history of anesthetic complications  Airway Mallampati: III  TM Distance: >3 FB Neck ROM: Full    Dental no notable dental hx. (+) Teeth Intact   Pulmonary asthma (no inhalers in months) , sleep apnea (nto using CPAP) , Patient abstained from smoking.Not current smoker, former smoker,  No inhalers in a long time; OSA prior to weight loss, now he does not need cpap    Pulmonary exam normal breath sounds clear to auscultation       Cardiovascular Exercise Tolerance: Good METS: > 9 Mets hypertension, Pt. on medications + CAD and + CABG  (-) Past MI and (-) CHF Normal cardiovascular exam(-) dysrhythmias (-) Valvular Problems/Murmurs Rhythm:Regular Rate:Normal  Plays tennis regularly   Neuro/Psych neg Seizures    GI/Hepatic Neg liver ROS, neg GERD  ,  Endo/Other  diabetes, Type 2, Oral Hypoglycemic Agents  Renal/GU Renal InsufficiencyRenal disease Bladder dysfunction      Musculoskeletal   Abdominal   Peds  Hematology   Anesthesia Other Findings Allergy    Asthma  cats  Coronary artery disease 10/04/2003 Cabg atrial septal defect repair//Cath [facility]) EF 70%, 3 vessell dz. (Dr.Callwood)10/04/2003  Diabetes mellitus  type 2  Hyperlipidemia    Hypertension    Renal disorder       Reproductive/Obstetrics                             Anesthesia Physical  Anesthesia Plan  ASA: III and emergent  Anesthesia Plan: General   Post-op Pain Management:    Induction: Intravenous and Rapid sequence  PONV Risk Score and Plan: 3 and Ondansetron,  Dexamethasone and Treatment may vary due to age or medical condition  Airway Management Planned: Oral ETT  Additional Equipment: None  Intra-op Plan:   Post-operative Plan: Extubation in OR  Informed Consent: I have reviewed the patients History and Physical, chart, labs and discussed the procedure including the risks, benefits and alternatives for the proposed anesthesia with the patient or authorized representative who has indicated his/her understanding and acceptance.     Dental advisory given  Plan Discussed with: CRNA, Anesthesiologist and Surgeon  Anesthesia Plan Comments: (In light of patient's NPO status, had discussion with surgeon Dr Yves Dill who deemed this case sufficiently urgent that it could not wait 8hours for NPO time, especially given patient's acute blood loss anemia. Will obtain type and screen, we will not delay case for result. Will likely administer blood as soon as T&S comes back.   Discussed risks of anesthesia with patient, including PONV, sore throat, lip/dental damage, aspiration. Rare risks discussed as well, such as cardiorespiratory and neurological sequelae. Patient understands.)        Anesthesia Quick Evaluation

## 2020-12-27 NOTE — Op Note (Signed)
Preoperative diagnosis: 1.  Hemorrhagic cystitis (N30.01)                                            2.  Clot urinary retention (R33.9)  Postoperative diagnosis: Same  Procedure: 1.  Cystoscopy with fulguration of bladder (CPT [phone])                      2.  Clot evacuation from the bladder (CPT [phone])                      3.  Foley catheter placement (CPT [phone])  Surgeon: Otelia Limes. Yves Dill MD  Anesthesia: General  Indications:See the history and physical. After informed consent the above procedure(s) were requested     Technique and findings: After adequate general anesthesia been obtained the patient was placed into dorsal lithotomy position and the perineum was prepped and draped in usual fashion.  80 French resectoscope coupled with a camera and visually advanced into the bladder.  Clots were evacuated with a Toomey syringe.  Prostate was normal with minimal BPH and no bleeding.  The bladder was thoroughly inspected.  He had 2 areas of hemorrhagic cystitis, one near the dome and the other along the left lateral wall.  Using the rollerball electrode these areas were fulgurated.  At this point there were no remaining areas of hemorrhage.  The resectoscope was removed and 10 cc of viscous Xylocaine instilled within the bladder.  A 24 French three-way Foley catheter was placed and irrigated until clear.  A B&O suppository was placed.  The procedure was then terminated and patient transferred to the recovery room in stable condition.

## 2020-12-27 NOTE — Progress Notes (Signed)
Patient transferred to room 209.  Reviewed with accepting RN:  Hassan Rowan Blood administration reviewed with RN, correct patient, unit #. CBI infusing "slowly" per Dr. Yves Dill, urine clear to faint pink tinged at intervals. Reviewed all above with accepting RN

## 2020-12-27 NOTE — Consult Note (Signed)
[facility] Medical Consultation  Garrett Woodard. [id number]:[id number] DOB: October 27, 1950 DOA: 12/27/2020 PCP: Tonia Ghent, MD   Requesting physician: Maryan Puls, MD (Urology) Date of consultation: 12/27/2020 Reason for consultation: Medical management  Impression/Recommendations Principal Problem:   Acute hemorrhagic cystitis Active Problems:   Diabetes mellitus without complication [facility])   Essential hypertension, benign   Pure hypercholesterolemia   CAD (coronary artery disease)   Hemorrhagic cystitis   Bladder cancer s/p TURBT 11/01/9377 complicated by hemorrhagic cystitis and clot urinary retention: S/p cystoscopy with fulguration of the bladder, clot evacuation, and Foley catheter placement by Dr. Yves Dill 5/27.  Acute blood loss anemia: Secondary to hemorrhagic cystitis.  Agree with transfusion 1 unit PRBC.  Repeat CBC in AM.  CAD s/p CABG in 2005: Stable, denies any chest pain.  Off aspirin since hematuria began.  Continue Toprol-XL and rosuvastatin.  Type 2 diabetes: Last A1c 7.5% 10/22/2020.  Hold home metformin and place on sensitive SSI for now.  Hypertension: Continue Toprol-XL  Hyperlipidemia: Continue rosuvastatin.  TRH will followup again tomorrow. Please contact us if we can be of assistance in the meanwhile. Thank you for this consultation.  Chief Complaint: Hemorrhagic cystitis  HPI:  Garrett Kardell. is a 70 y.o. male with medical history significant for CAD s/p CABG, T2DM, HTN, HLD, and recently diagnosed bladder cancer s/p TURBT 11/07/2020 by Dr. Yves Dill who developed postoperative hemorrhagic cystitis with clot urinary retention.  This was suspected related to mitomycin use per urology.  He underwent cystoscopy with fulguration of the bladder, clot evacuation, and Foley catheter placement with continuous bladder irrigation earlier today 12/27/2020.  Hemoglobin is noted to be 7.3, down from 14.4 three months ago.  He will be receiving 1 unit  PRBC transfusion.  The hospitalist service was consulted to assist with further medical management.  Patient seen in PACU postop.  He is feeling well.  He is now receiving 1 unit PRBC transfusion.  He has been on continuous bladder irrigation with clear output in the collecting bag.  He says he has been off aspirin since he developed hematuria.  He has also stopped taking NSAIDs.  He denies any chest pain, dyspnea, nausea, vomiting, abdominal pain.  Review of Systems:  All systems reviewed and are negative except as documented in history of present illness above.  Past Medical History:  Diagnosis Date  . Allergy   . Asthma    cats  . Coronary artery disease 10/04/2003   Cabg atrial septal defect repair//[facility]) EF 70%, 3 vessell dz. (Dr.Callwood)10/04/2003  . Diabetes mellitus    type 2  . History of kidney stones   . Hyperlipidemia   . Hypertension   . Renal disorder   . Sleep apnea    Past Surgical History:  Procedure Laterality Date  . CORONARY ANGIOPLASTY  10/04/2003   cath.  at [facility]): EF70%, 3 vessell dz. Dr. Gwenlyn Saran wnl 11/16/2003// Exercise Cardiolite wnl EF 60% 12/03/2003  . CORONARY ARTERY BYPASS GRAFT  10/2003   CABG atrial septal defect repair  . CYSTOSCOPY WITH FULGERATION N/A 12/24/2020   Procedure: CYSTOSCOPY WITH FULGERATION;  Surgeon: Royston Cowper, MD;  Location: [facility] ORS;  Service: Urology;  Laterality: N/A;  . TRANSURETHRAL RESECTION OF BLADDER TUMOR N/A 11/07/2020   Procedure: TRANSURETHRAL RESECTION OF BLADDER TUMOR (TURBT) WITH MITOMYCIN;  Surgeon: Royston Cowper, MD;  Location: [facility] ORS;  Service: Urology;  Laterality: N/A;   Social History:  reports that he quit smoking about 18 years ago.  His smoking use included cigarettes. He has never used smokeless tobacco. He reports current alcohol use. He reports that he does not use drugs.  No Known Allergies Family History  Problem Relation Age of Onset  . Stroke Mother   . Hypertension Mother    . Depression Mother   . Heart disease Father 57       CABG x4  . Cancer Father        lymphoma at age 54  . Alcohol abuse Sister        knee replacement bilaterial disabled  . Prostate cancer Neg Hx   . Colon cancer Neg Hx     Prior to Admission medications   Medication Sig Start Date End Date Taking? Authorizing Provider  Hyoscyamine Sulfate SL (LEVSIN/SL) 0.125 MG SUBL Place 0.125 mg under the tongue every 4 (four) hours as needed (bladder spasms, pain). 1-2 TABS 11/07/20  Yes Royston Cowper, MD  metFORMIN (GLUCOPHAGE) 500 MG tablet 4 tabs per day 10/22/20  Yes Tonia Ghent, MD  Meth-Hyo-M Bl-Na Phos-Ph Sal (URIBEL) 118 MG CAPS Take 1 capsule (118 mg total) by mouth every 6 (six) hours as needed (dysuria). 11/07/20  Yes Royston Cowper, MD  Meth-Hyo-M Barnett Hatter Phos-Ph Sal (URIBEL) 118 MG CAPS Take 1 capsule (118 mg total) by mouth every 6 (six) hours as needed (bladder spasms, dysuria). 12/24/20  Yes Royston Cowper, MD  metoprolol tartrate (LOPRESSOR) 25 MG tablet TAKE 1/2 TABLET BY MOUTH EVERY DAY 05/23/20  Yes Tonia Ghent, MD  rosuvastatin (CRESTOR) 20 MG tablet TAKE 1 TABLET EVERY DAY 05/23/20  Yes Tonia Ghent, MD  tamsulosin (FLOMAX) 0.4 MG CAPS capsule Take 0.4 mg by mouth daily.   Yes [provider]  ciprofloxacin (CIPRO) 500 MG tablet Take 1 tablet (500 mg total) by mouth 2 (two) times daily. Patient not taking: Reported on 12/27/2020 11/07/20   Royston Cowper, MD  OVER THE COUNTER MEDICATION 1 capsule. Patient not taking: No sig reported    [provider]   Physical Exam: Blood pressure 130/76, pulse 92, temperature 98 F (36.7 C), resp. rate 14, height 6\' 2"  (1.88 m), weight 98.9 kg, SpO2 100 %. Vitals:   12/27/20 1843 12/27/20 1905  BP: (!) 146/70 130/76  Pulse: 96 92  Resp: 16 14  Temp: 98.2 F (36.8 C) 98 F (36.7 C)  SpO2: 98% 100%   Constitutional: Pale complexion, resting supine in bed, NAD, calm, comfortable Eyes: PERRL, lids and  conjunctivae normal ENMT: Mucous membranes are moist. Posterior pharynx clear of any exudate or lesions.Normal dentition.  Neck: normal, supple, no masses. Respiratory: clear to auscultation bilaterally, no wheezing, no crackles. Normal respiratory effort. No accessory muscle use.  Cardiovascular: Regular rate and rhythm, no murmurs / rubs / gallops. No extremity edema. 2+ pedal pulses. Abdomen: no tenderness, no masses palpated. No hepatosplenomegaly. GU: Foley catheter in place Musculoskeletal: no clubbing / cyanosis. No joint deformity upper and lower extremities. Good ROM, no contractures. Normal muscle tone.  Skin: no rashes, lesions, ulcers. No induration Neurologic: CN 2-12 grossly intact. Sensation intact. Strength 5/5 in all 4.  Psychiatric: Normal judgment and insight. Alert and oriented x 3. Normal mood.   Labs on Admission:  Basic Metabolic Panel: No results for input(s): NA, K, CL, CO2, GLUCOSE, BUN, CREATININE, CALCIUM, MG, PHOS in the last 168 hours. Liver Function Tests: No results for input(s): AST, ALT, ALKPHOS, BILITOT, PROT, ALBUMIN in the last 168 hours. No results for input(s):  LIPASE, AMYLASE in the last 168 hours. No results for input(s): AMMONIA in the last 168 hours. CBC: Recent Labs  Lab 12/27/20 1609  WBC 11.2*  HGB 7.3*  HCT 21.4*  MCV 92.6  PLT 293   Cardiac Enzymes: No results for input(s): CKTOTAL, CKMB, CKMBINDEX, TROPONINI in the last 168 hours. BNP: Invalid input(s): POCBNP CBG: Recent Labs  Lab 12/24/20 1218 12/24/20 1404 12/24/20 1521 12/27/20 1615 12/27/20 1815  GLUCAP 209* 197* 162* 180* 147*    Radiological Exams on Admission: No results found.  EKG: Not performed.   Zada Finders, MD [facility]   If 7PM-7AM, please contact night-coverage [email or web address] 12/27/2020, 7:13 PM

## 2020-12-27 NOTE — Telephone Encounter (Signed)
Called patient; he was at the urologist when I called and stated he would call me back. He already has MRI scheduled for 01/10/21 at 10 am

## 2020-12-27 NOTE — H&P (Signed)
Date of Initial H&P: 12/24/20  History reviewed, patient examined, no change in status, stable for surgery. Patient developed clot retention today.

## 2020-12-27 NOTE — Progress Notes (Signed)
Garrett Woodard has developed recurrent clot urinary retention with significant loss of blood indicated by hemoglobin of 7.3.  He has agreed to cystoscopy with clot evacuation, fulguration and possible other interventions.  He also understands that he may require hospitalization and possible blood transfusion.

## 2020-12-27 NOTE — Transfer of Care (Signed)
Immediate Anesthesia Transfer of Care Note  Patient: Garrett Woodard.  Procedure(s) Performed: [place] (N/A )  Patient Location: PACU  Anesthesia Type:General  Level of Consciousness: drowsy and patient cooperative  Airway & Oxygen Therapy: Patient Spontanous Breathing  Post-op Assessment: Report given to RN and Post -op Vital signs reviewed and stable  Post vital signs: Reviewed and stable  Last Vitals:  Vitals Value Taken Time  BP 140/72 12/27/20 1815  Temp 36.8 C 12/27/20 1810  Pulse 121 12/27/20 1816  Resp 20 12/27/20 1816  SpO2 100 % 12/27/20 1816  Vitals shown include unvalidated device data.  Last Pain:  Vitals:   12/27/20 1810  TempSrc:   PainSc: 0-No pain         Complications: No complications documented.

## 2020-12-27 NOTE — Anesthesia Postprocedure Evaluation (Signed)
Anesthesia Post Note  Patient: Garrett Woodard.  Procedure(s) Performed: [place] (N/A )  Patient location during evaluation: PACU Anesthesia Type: General Level of consciousness: awake and alert Pain management: pain level controlled Vital Signs Assessment: post-procedure vital signs reviewed and stable Respiratory status: spontaneous breathing, nonlabored ventilation, respiratory function stable and patient connected to nasal cannula oxygen Cardiovascular status: blood pressure returned to baseline and stable Postop Assessment: no apparent nausea or vomiting Anesthetic complications: no Comments: Patient stable, no evidence of aspiration. Will administer 1u PRBC for acute anemia.   No complications documented.   Last Vitals:  Vitals:   12/27/20 1618 12/27/20 1810  BP: (!) 143/71 (!) 142/71  Pulse: 98 (!) 102  Resp: 18 12  Temp: 36.7 C 36.8 C  SpO2: 99% 100%    Last Pain:  Vitals:   12/27/20 1810  TempSrc:   PainSc: 0-No pain                 Arita Miss

## 2020-12-28 ENCOUNTER — Encounter: Payer: Self-pay | Admitting: Urology

## 2020-12-28 DIAGNOSIS — I10 Essential (primary) hypertension: Secondary | ICD-10-CM | POA: Diagnosis not present

## 2020-12-28 DIAGNOSIS — E1169 Type 2 diabetes mellitus with other specified complication: Secondary | ICD-10-CM

## 2020-12-28 DIAGNOSIS — J45909 Unspecified asthma, uncomplicated: Secondary | ICD-10-CM | POA: Diagnosis not present

## 2020-12-28 DIAGNOSIS — D62 Acute posthemorrhagic anemia: Secondary | ICD-10-CM

## 2020-12-28 DIAGNOSIS — I2510 Atherosclerotic heart disease of native coronary artery without angina pectoris: Secondary | ICD-10-CM | POA: Diagnosis not present

## 2020-12-28 DIAGNOSIS — Z8551 Personal history of malignant neoplasm of bladder: Secondary | ICD-10-CM | POA: Diagnosis not present

## 2020-12-28 DIAGNOSIS — E785 Hyperlipidemia, unspecified: Secondary | ICD-10-CM

## 2020-12-28 DIAGNOSIS — Z87891 Personal history of nicotine dependence: Secondary | ICD-10-CM | POA: Diagnosis not present

## 2020-12-28 DIAGNOSIS — R339 Retention of urine, unspecified: Secondary | ICD-10-CM | POA: Diagnosis not present

## 2020-12-28 DIAGNOSIS — N3001 Acute cystitis with hematuria: Secondary | ICD-10-CM | POA: Diagnosis not present

## 2020-12-28 DIAGNOSIS — Z20822 Contact with and (suspected) exposure to covid-19: Secondary | ICD-10-CM | POA: Diagnosis not present

## 2020-12-28 DIAGNOSIS — E119 Type 2 diabetes mellitus without complications: Secondary | ICD-10-CM | POA: Diagnosis not present

## 2020-12-28 LAB — BASIC METABOLIC PANEL
Anion gap: 6 (ref 5–15)
BUN: 9 mg/dL (ref 8–23)
CO2: 25 mmol/L (ref 22–32)
Calcium: 8.6 mg/dL — ABNORMAL LOW (ref 8.9–10.3)
Chloride: 106 mmol/L (ref 98–111)
Creatinine, Ser: 0.88 mg/dL (ref 0.61–1.24)
GFR, Estimated: >60 mL/min (ref >60)
Glucose, Bld: 252 mg/dL — ABNORMAL HIGH (ref 70–99)
Potassium: 4.4 mmol/L (ref 3.5–5.1)
Sodium: 137 mmol/L (ref 135–145)

## 2020-12-28 LAB — CBC WITH DIFFERENTIAL/PLATELET
Abs Immature Granulocytes: 0.03 10*3/uL (ref 0.00–0.07)
Basophils Absolute: 0 10*3/uL (ref 0.0–0.1)
Basophils Relative: 0 %
Eosinophils Absolute: 0 10*3/uL (ref 0.0–0.5)
Eosinophils Relative: 0 %
HCT: 22.8 % — ABNORMAL LOW (ref 39.0–52.0)
Hemoglobin: 7.8 g/dL — ABNORMAL LOW (ref 13.0–17.0)
Immature Granulocytes: 0 %
Lymphocytes Relative: 8 %
Lymphs Abs: 0.6 10*3/uL — ABNORMAL LOW (ref 0.7–4.0)
MCH: 31.1 pg (ref 26.0–34.0)
MCHC: 34.2 g/dL (ref 30.0–36.0)
MCV: 90.8 fL (ref 80.0–100.0)
Monocytes Absolute: 0.3 10*3/uL (ref 0.1–1.0)
Monocytes Relative: 4 %
Neutro Abs: 6.9 10*3/uL (ref 1.7–7.7)
Neutrophils Relative %: 88 %
Platelets: 270 10*3/uL (ref 150–400)
RBC: 2.51 MIL/uL — ABNORMAL LOW (ref 4.22–5.81)
RDW: 13.3 % (ref 11.5–15.5)
WBC: 7.8 10*3/uL (ref 4.0–10.5)
nRBC: 0 % (ref 0.0–0.2)

## 2020-12-28 LAB — GLUCOSE, CAPILLARY
Glucose-Capillary: 220 mg/dL — ABNORMAL HIGH (ref 70–99)
Glucose-Capillary: 209 mg/dL — ABNORMAL HIGH (ref 70–99)

## 2020-12-28 LAB — BPAM RBC
Blood Product Expiration Date: 202206132359
ISSUE DATE / TIME: 202205271839
Unit Type and Rh: 6200

## 2020-12-28 LAB — TYPE AND SCREEN
ABO/RH(D): A POS
Antibody Screen: NEGATIVE
Unit division: 0

## 2020-12-28 MED ORDER — FERROUS SULFATE 325 (65 FE) MG PO TABS
325.0000 mg | ORAL_TABLET | Freq: Every day | ORAL | Status: DC
  Administered 2020-12-28: 325 mg via ORAL
  Filled 2020-12-28: qty 1

## 2020-12-28 MED ORDER — FERROUS SULFATE 325 (65 FE) MG PO TABS: 325.0000 mg | ORAL_TABLET | Freq: Every day | ORAL | 0 refills | Status: DC

## 2020-12-28 MED ORDER — CHLORHEXIDINE GLUCONATE CLOTH 2 % EX PADS: 6.0000 | MEDICATED_PAD | Freq: Every day | CUTANEOUS | Status: DC

## 2020-12-28 NOTE — Progress Notes (Signed)
The patient has no complaints today.  He is not experienced any bladder spasm.  He has not had any hematuria.  Hemoglobin is 7.8 now.  He denies orthostasis. Plan: Discontinue Foley and CBI.  If patient voids well with clear urine he may be discharged this afternoon and follow-up in the office in 1 week.

## 2020-12-28 NOTE — Progress Notes (Signed)
Patient ID: Garrett Fail., male   DOB: [birth year], 70 y.o.   MRN: [id number] [facility] PROGRESS NOTE  Garrett Fail. KXF:[id number] DOB: Sep 19, 1950 DOA: 12/27/2020 PCP: Tonia Ghent, MD  HPI/Subjective: Patient feeling okay.  Offers no complaints.  Seen this morning and urine was clear.  No abdominal pain.  He has had bleeding going on for a while now.  Had procedure yesterday by Dr. Eliberto Ivory.  Received a blood transfusion for acute on chronic blood loss anemia  Objective: Vitals:   12/27/20 2330 12/28/20 0456  BP: 132/66 (!) 114/56  Pulse: 72 68  Resp: 18 18  Temp: 98.9 F (37.2 C) 97.9 F (36.6 C)  SpO2: 96% 99%    Intake/Output Summary (Last 24 hours) at 12/28/2020 0831 Last data filed at 12/28/2020 0600 Gross per 24 hour  Intake 2685.83 ml  Output 9350 ml  Net -6664.17 ml   Filed Weights   12/27/20 1618  Weight: 98.9 kg    ROS: Review of Systems  Respiratory: Negative for shortness of breath.   Cardiovascular: Negative for chest pain.  Gastrointestinal: Negative for abdominal pain, nausea and vomiting.   Exam: Physical Exam HENT:     Head: Normocephalic.     Mouth/Throat:     Pharynx: No oropharyngeal exudate.  Eyes:     General: Lids are normal.     Conjunctiva/sclera: Conjunctivae normal.     Pupils: Pupils are equal, round, and reactive to light.  Cardiovascular:     Rate and Rhythm: Normal rate and regular rhythm.     Heart sounds: Normal heart sounds, S1 normal and S2 normal.  Pulmonary:     Breath sounds: Normal breath sounds. No decreased breath sounds, wheezing, rhonchi or rales.  Abdominal:     Palpations: Abdomen is soft.     Tenderness: There is no abdominal tenderness.  Musculoskeletal:     Right lower leg: No swelling.     Left lower leg: No swelling.  Skin:    General: Skin is warm.     Findings: No rash.  Neurological:     Mental Status: He is alert and oriented to person, place, and time.       Data  Reviewed: Basic Metabolic Panel: Recent Labs  Lab 12/28/20 0417  NA 137  K 4.4  CL 106  CO2 25  GLUCOSE 252*  BUN 9  CREATININE 0.88  CALCIUM 8.6*   CBC: Recent Labs  Lab 12/27/20 1609 12/28/20 0417  WBC 11.2* 7.8  NEUTROABS  --  6.9  HGB 7.3* 7.8*  HCT 21.4* 22.8*  MCV 92.6 90.8  PLT 293 270    CBG: Recent Labs  Lab 12/24/20 1521 12/27/20 1615 12/27/20 1815 12/27/20 2330 12/28/20 0743  GLUCAP 162* 180* 147* 288* 220*    Recent Results (from the past 240 hour(s))  Resp Panel by RT-PCR (Flu A&B, Covid) Nasopharyngeal Swab     Status: None   Collection Time: 12/27/20  6:18 PM   Specimen: Nasopharyngeal Swab; Nasopharyngeal(NP) swabs in vial transport medium  Result Value Ref Range Status   SARS Coronavirus 2 by RT PCR NEGATIVE NEGATIVE Final    Comment: (NOTE) SARS-CoV-2 target nucleic acids are NOT DETECTED.  The SARS-CoV-2 RNA is generally detectable in upper respiratory specimens during the acute phase of infection. The lowest concentration of SARS-CoV-2 viral copies this assay can detect is 138 copies/mL. A negative result does not preclude SARS-Cov-2 infection and should not be used as the sole basis  for treatment or other patient management decisions. A negative result may occur with  improper specimen collection/handling, submission of specimen other than nasopharyngeal swab, presence of viral mutation(s) within the areas targeted by this assay, and inadequate number of viral copies(<138 copies/mL). A negative result must be combined with clinical observations, patient history, and epidemiological information. The expected result is Negative.  Fact Sheet for Patients:  EntrepreneurPulse.com.au  Fact Sheet for Healthcare Providers:  IncredibleEmployment.be  This test is no t yet approved or cleared by the [place] FDA and  has been authorized for detection and/or diagnosis of SARS-CoV-2 by FDA under an  Emergency Use Authorization (EUA). This EUA will remain  in effect (meaning this test can be used) for the duration of the COVID-19 declaration under Section 564(b)(1) of the Act, 21 U.S.C.section 360bbb-3(b)(1), unless the authorization is terminated  or revoked sooner.       Influenza A by PCR NEGATIVE NEGATIVE Final   Influenza B by PCR NEGATIVE NEGATIVE Final    Comment: (NOTE) The Xpert Xpress SARS-CoV-2/FLU/RSV plus assay is intended as an aid in the diagnosis of influenza from Nasopharyngeal swab specimens and should not be used as a sole basis for treatment. Nasal washings and aspirates are unacceptable for Xpert Xpress SARS-CoV-2/FLU/RSV testing.  Fact Sheet for Patients: EntrepreneurPulse.com.au  Fact Sheet for Healthcare Providers: IncredibleEmployment.be  This test is not yet approved or cleared by the [place] FDA and has been authorized for detection and/or diagnosis of SARS-CoV-2 by FDA under an Emergency Use Authorization (EUA). This EUA will remain in effect (meaning this test can be used) for the duration of the COVID-19 declaration under Section 564(b)(1) of the Act, 21 U.S.C. section 360bbb-3(b)(1), unless the authorization is terminated or revoked.  Performed at [facility], [address] [place], [place] [address]       Scheduled Meds: . sodium chloride   Intravenous Once  . Chlorhexidine Gluconate Cloth  6 each Topical Daily  . ferrous sulfate  325 mg Oral Q breakfast  . insulin aspart  0-9 Units Subcutaneous TID WC  . metoprolol tartrate  12.5 mg Oral Daily  . rosuvastatin  20 mg Oral Daily  . tamsulosin  0.4 mg Oral Daily   Continuous Infusions: . sodium chloride irrigation      Assessment/Plan:  1. Acute on chronic blood loss anemia.  Patient was transfused a unit of packed red blood cells on a hemoglobin of 7.3.  Hemoglobin came up to 7.8.  Patient feeling well.  Start ferrous  sulfate. 2. Acute hemorrhagic cystitis secondary to BCG.  History of bladder tumor.  Patient had cystoscopy procedure by Dr. Yves Dill and cauterization.  On continuous bladder irrigation this morning and urine was clear.  Dr. Rock Nephew may discharge this afternoon. 3. History of CAD.  Continue Toprol and Crestor 4. Type 2 diabetes mellitus with hyperlipidemia on Crestor..  Holding metformin while here in the hospital 5. Essential hypertension on Toprol-XL 6. Hyperlipidemia unspecified on Crestor        Code Status:     Code Status Orders  (From admission, onward)         Start     Ordered   12/27/20 1807  Full code  Continuous        12/27/20 1819        Code Status History    This patient has a current code status but no historical code status.   Advance Care Planning Activity     Disposition Plan:  Status is: Observation  Dispo: The patient is from: Home              Anticipated d/c is to: Home              Patient currently feeling well after procedure yesterday and blood transfusion.   Difficult to place patient.  No.  Time spent: 27 minutes  [place]

## 2020-12-28 NOTE — Progress Notes (Signed)
Patient refused morning insulin - he states that he never checks his blood sugar at home he uses the hgbA1C

## 2020-12-28 NOTE — Progress Notes (Signed)
Reviewed discharge instructions.  Patient does not have Uribel and states that his pharmacy does not have nor does  his insurance pay for it.  He states if he needs it he will call the physician

## 2020-12-28 NOTE — Plan of Care (Signed)

## 2021-01-01 NOTE — Telephone Encounter (Signed)
Spoke with patient and he is doing okay. He had an emergency surgery Friday and is currently recovering. Notes are in EMR. He does have his MRI scheduled for 01/10/21 also.

## 2021-01-01 NOTE — Telephone Encounter (Signed)
Noted. Thanks for checking on patient.

## 2021-01-03 DIAGNOSIS — C676 Malignant neoplasm of ureteric orifice: Secondary | ICD-10-CM | POA: Diagnosis not present

## 2021-01-03 DIAGNOSIS — R351 Nocturia: Secondary | ICD-10-CM | POA: Diagnosis not present

## 2021-01-03 DIAGNOSIS — R3915 Urgency of urination: Secondary | ICD-10-CM | POA: Diagnosis not present

## 2021-01-09 NOTE — Interval H&P Note (Signed)
History and Physical Interval Note:  01/09/2021 7:01 AM  Garrett Woodard.  has presented today for surgery, with the diagnosis of Clot Retention.  The various methods of treatment have been discussed with the patient and family. After consideration of risks, benefits and other options for treatment, the patient has consented to  Procedure(s): [place] (N/A) as a surgical intervention.  The patient's history has been reviewed, patient examined, no change in status, stable for surgery.  I have reviewed the patient's chart and labs.  Questions were answered to the patient's satisfaction.     Royston Cowper

## 2021-01-09 NOTE — Interval H&P Note (Signed)
History and Physical Interval Note:  01/09/2021 7:01 AM  Garrett Woodard.  has presented today for surgery, with the diagnosis of Clot Retention.  The various methods of treatment have been discussed with the patient and family. After consideration of risks, benefits and other options for treatment, the patient has consented to  Procedure(s): Purdin (N/A) as a surgical intervention.  The patient's history has been reviewed, patient examined, no change in status, stable for surgery.  I have reviewed the patient's chart and labs.  Questions were answered to the patient's satisfaction.     Royston Cowper

## 2021-01-10 ENCOUNTER — Other Ambulatory Visit: Payer: Self-pay

## 2021-01-10 ENCOUNTER — Ambulatory Visit
Admission: RE | Admit: 2021-01-10 | Discharge: 2021-01-10 | Disposition: A | Discharge status: Home / Self Care | Payer: Medicare HMO | Source: Ambulatory Visit | Attending: Family Medicine | Admitting: Family Medicine

## 2021-01-10 DIAGNOSIS — K769 Liver disease, unspecified: Secondary | ICD-10-CM | POA: Insufficient documentation

## 2021-01-10 DIAGNOSIS — K760 Fatty (change of) liver, not elsewhere classified: Secondary | ICD-10-CM | POA: Diagnosis not present

## 2021-01-10 DIAGNOSIS — N281 Cyst of kidney, acquired: Secondary | ICD-10-CM | POA: Diagnosis not present

## 2021-01-10 DIAGNOSIS — I700 Atherosclerosis of aorta: Secondary | ICD-10-CM | POA: Diagnosis not present

## 2021-01-10 MED ORDER — GADOBUTROL 1 MMOL/ML IV SOLN
10.0000 mL | Freq: Once | INTRAVENOUS | Status: AC | PRN
  Administered 2021-01-10: 10 mL via INTRAVENOUS

## 2021-02-20 DIAGNOSIS — H2513 Age-related nuclear cataract, bilateral: Secondary | ICD-10-CM | POA: Diagnosis not present

## 2021-02-20 DIAGNOSIS — Z7984 Long term (current) use of oral hypoglycemic drugs: Secondary | ICD-10-CM | POA: Diagnosis not present

## 2021-02-20 DIAGNOSIS — H5203 Hypermetropia, bilateral: Secondary | ICD-10-CM | POA: Diagnosis not present

## 2021-02-20 DIAGNOSIS — E119 Type 2 diabetes mellitus without complications: Secondary | ICD-10-CM | POA: Diagnosis not present

## 2021-02-20 DIAGNOSIS — H524 Presbyopia: Secondary | ICD-10-CM | POA: Diagnosis not present

## 2021-02-20 DIAGNOSIS — H52223 Regular astigmatism, bilateral: Secondary | ICD-10-CM | POA: Diagnosis not present

## 2021-04-02 DIAGNOSIS — H2513 Age-related nuclear cataract, bilateral: Secondary | ICD-10-CM | POA: Diagnosis not present

## 2021-04-02 DIAGNOSIS — H2512 Age-related nuclear cataract, left eye: Secondary | ICD-10-CM | POA: Diagnosis not present

## 2021-04-02 DIAGNOSIS — H25043 Posterior subcapsular polar age-related cataract, bilateral: Secondary | ICD-10-CM | POA: Diagnosis not present

## 2021-04-02 DIAGNOSIS — H25013 Cortical age-related cataract, bilateral: Secondary | ICD-10-CM | POA: Diagnosis not present

## 2021-04-02 DIAGNOSIS — E119 Type 2 diabetes mellitus without complications: Secondary | ICD-10-CM | POA: Diagnosis not present

## 2021-04-08 ENCOUNTER — Other Ambulatory Visit: Payer: Self-pay | Admitting: Family Medicine

## 2021-04-08 DIAGNOSIS — E119 Type 2 diabetes mellitus without complications: Secondary | ICD-10-CM

## 2021-04-08 DIAGNOSIS — E7800 Pure hypercholesterolemia, unspecified: Secondary | ICD-10-CM

## 2021-04-08 DIAGNOSIS — I10 Essential (primary) hypertension: Secondary | ICD-10-CM

## 2021-04-08 DIAGNOSIS — Z8551 Personal history of malignant neoplasm of bladder: Secondary | ICD-10-CM | POA: Diagnosis not present

## 2021-04-29 DIAGNOSIS — H2512 Age-related nuclear cataract, left eye: Secondary | ICD-10-CM | POA: Diagnosis not present

## 2021-04-29 DIAGNOSIS — H2511 Age-related nuclear cataract, right eye: Secondary | ICD-10-CM | POA: Diagnosis not present

## 2021-04-29 DIAGNOSIS — H25012 Cortical age-related cataract, left eye: Secondary | ICD-10-CM | POA: Diagnosis not present

## 2021-05-06 DIAGNOSIS — H2511 Age-related nuclear cataract, right eye: Secondary | ICD-10-CM | POA: Diagnosis not present

## 2021-05-06 DIAGNOSIS — H25011 Cortical age-related cataract, right eye: Secondary | ICD-10-CM | POA: Diagnosis not present

## 2021-07-09 DIAGNOSIS — Z87442 Personal history of urinary calculi: Secondary | ICD-10-CM | POA: Diagnosis not present

## 2021-07-09 DIAGNOSIS — N401 Enlarged prostate with lower urinary tract symptoms: Secondary | ICD-10-CM | POA: Diagnosis not present

## 2021-07-09 DIAGNOSIS — Z8551 Personal history of malignant neoplasm of bladder: Secondary | ICD-10-CM | POA: Diagnosis not present

## 2021-08-28 IMAGING — CR DG ABDOMEN 1V
2 series · 2 of 2 positions shown · non-contrast
Comparison: None.

CLINICAL DATA: 69-year-old male with hematuria.

EXAM:
ABDOMEN - 1 VIEW

[abdomen kub (1 of 2)]
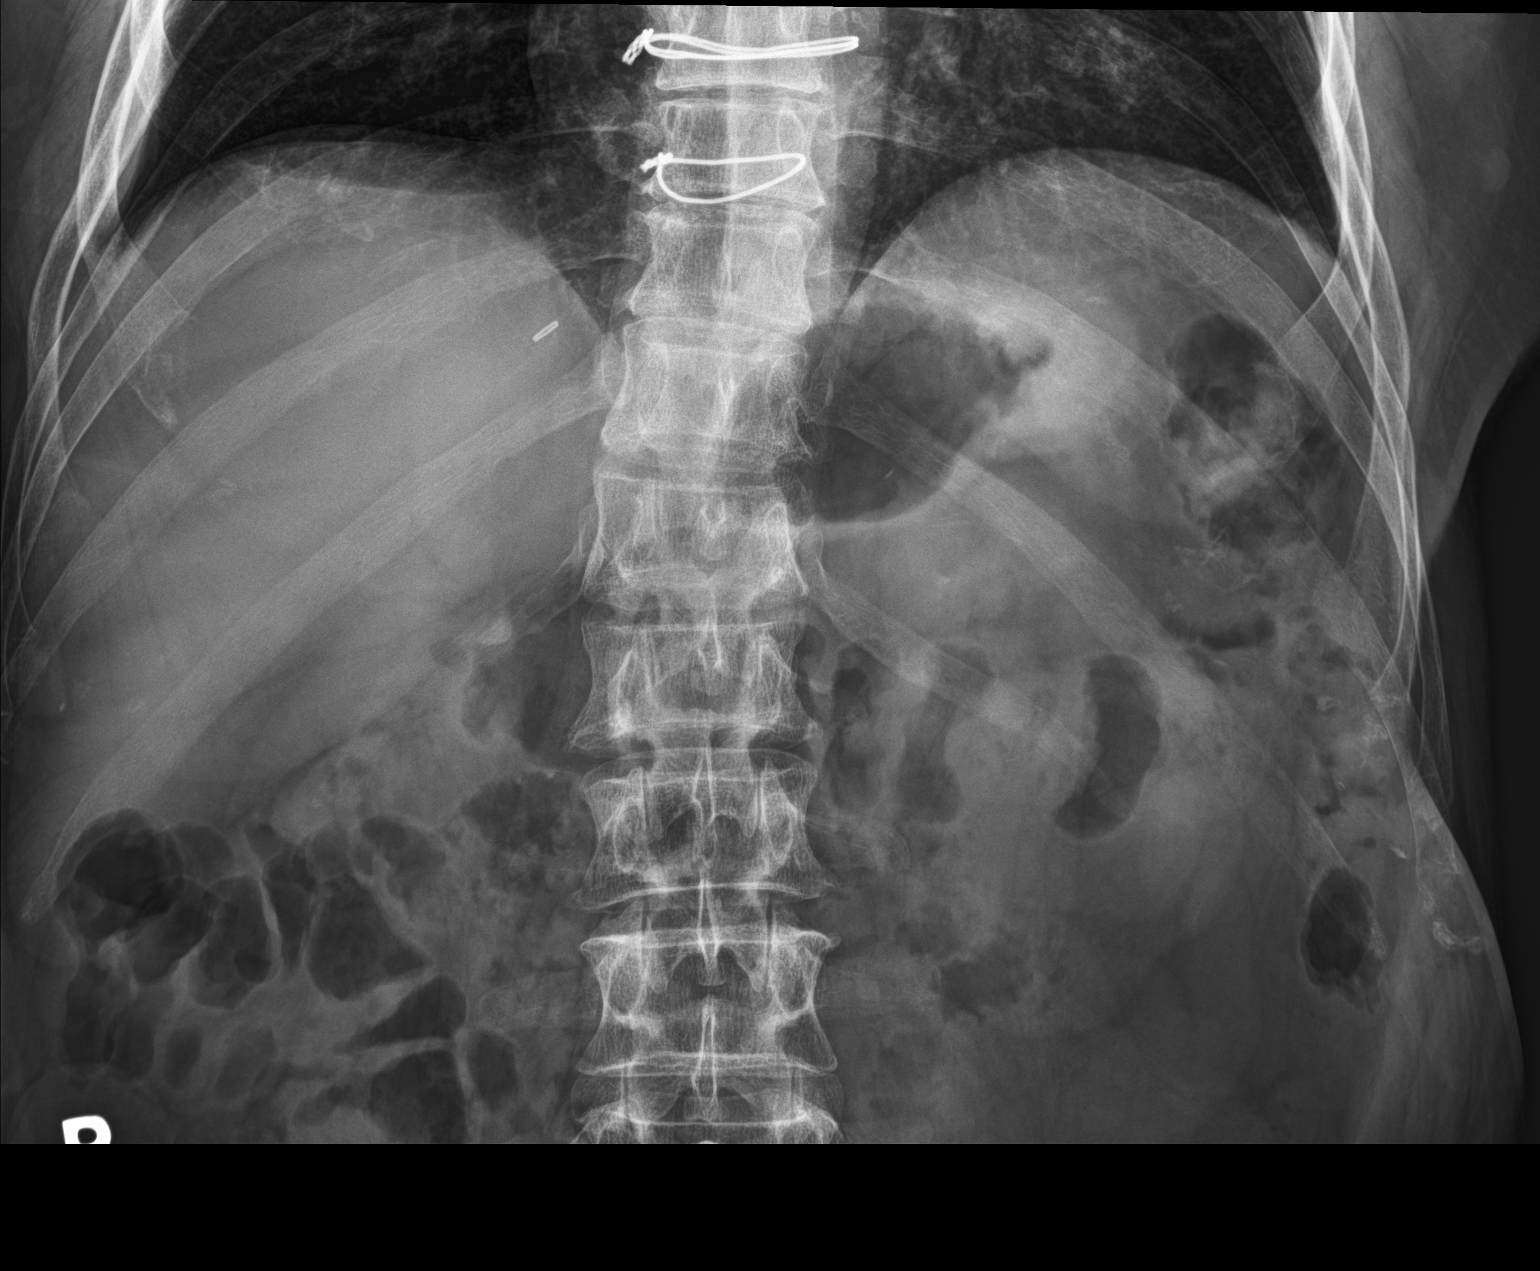

[abdomen kub (2 of 2)]
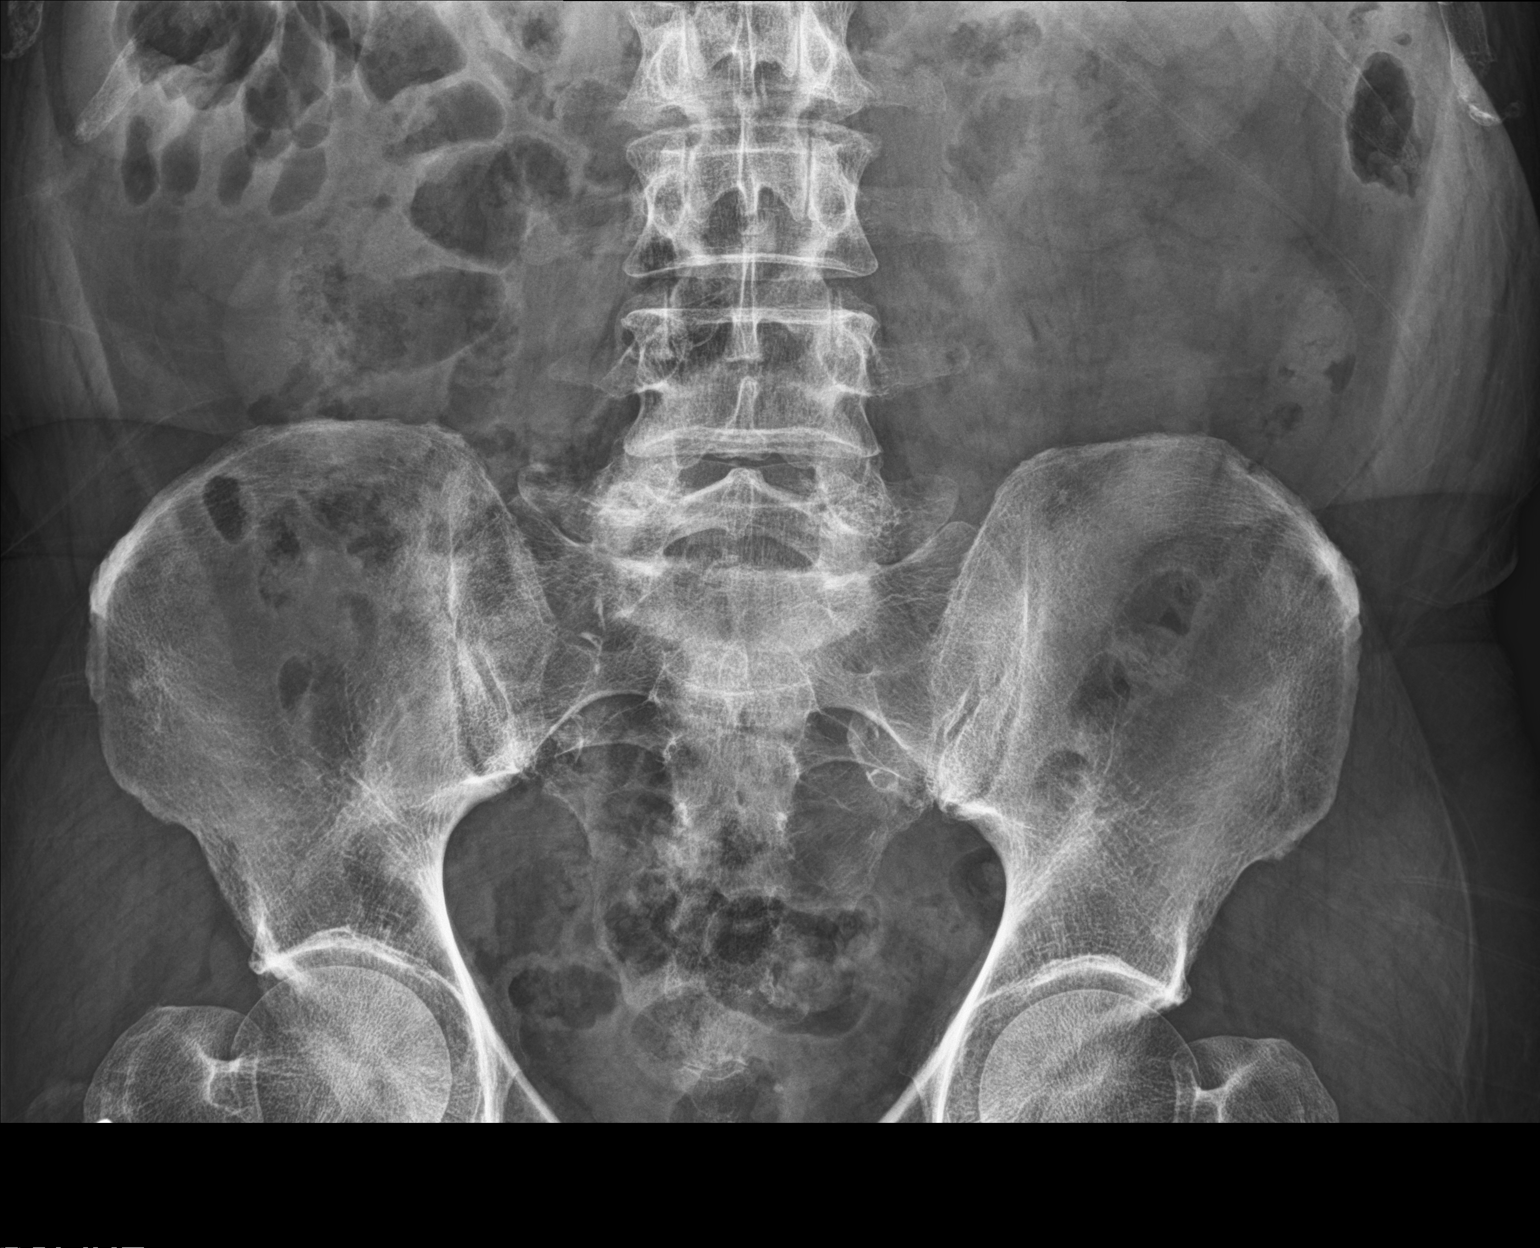

[2 of 2 positions shown; findings below may reference images not displayed]

FINDINGS: Punctate radiopaque focus in the left upper abdomen may be
artifactual or related to bowel content. A small stone in the upper
pole of the left kidney is not excluded. A 9 mm radiopaque focus is
noted in the right upper abdomen at the level of T12-L1 disc space.
No bowel dilatation or evidence of obstruction. No free air. The
osseous structures are intact.
IMPRESSION: 1. Artifact versus a punctate left renal upper pole stone.
2. A 9 mm radiopaque focus in the right upper abdomen.
3. No bowel obstruction.

## 2021-08-28 IMAGING — CT CT RENAL STONE PROTOCOL
2 of 4 series · 15 of 46 positions shown, 17 images · non-contrast
Comparison: 09/06/2020

CLINICAL DATA: Left flank pain, hematuria, proteinuria

EXAM:
CT ABDOMEN AND PELVIS WITHOUT CONTRAST
TECHNIQUE: Multidetector CT imaging of the abdomen and pelvis was performed
following the standard protocol without IV contrast.

[Series 2: stone full standard · axial · 0.86mm/px · z∈[-114,+366]mm · 12 of 110 slices shown, 14 images]
[im 9/110  soft-tissue]
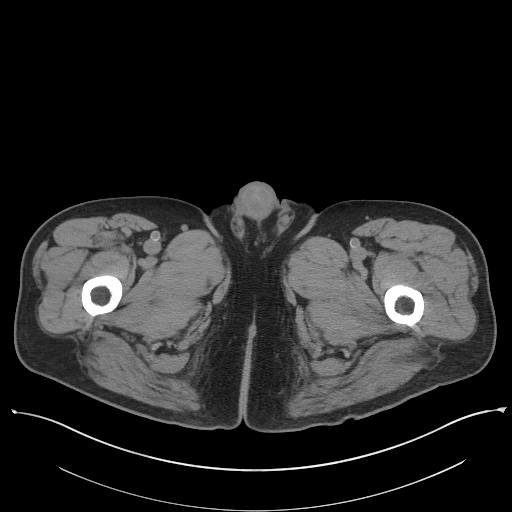
[im 9/110  bone]
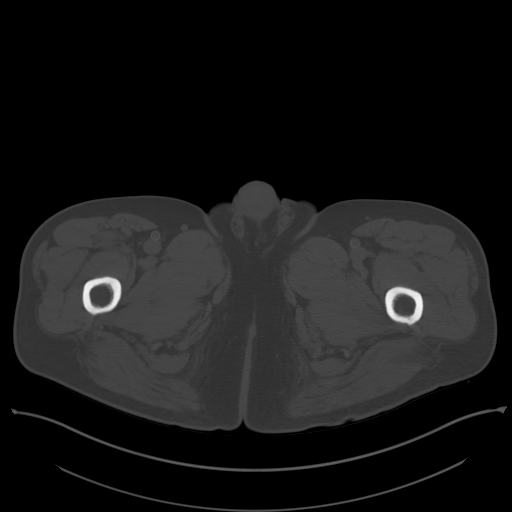
[im 18/110  soft-tissue]
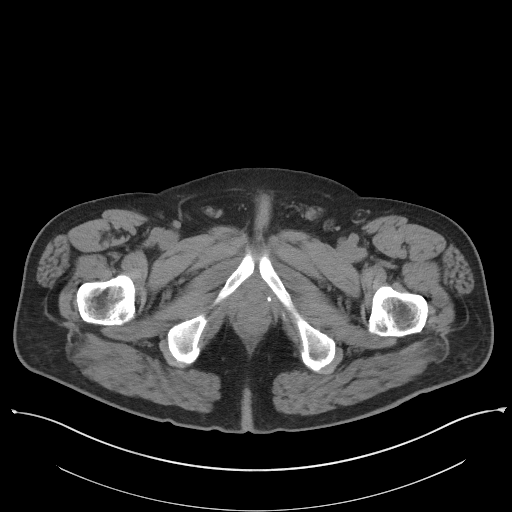
[im 27/110  soft-tissue]
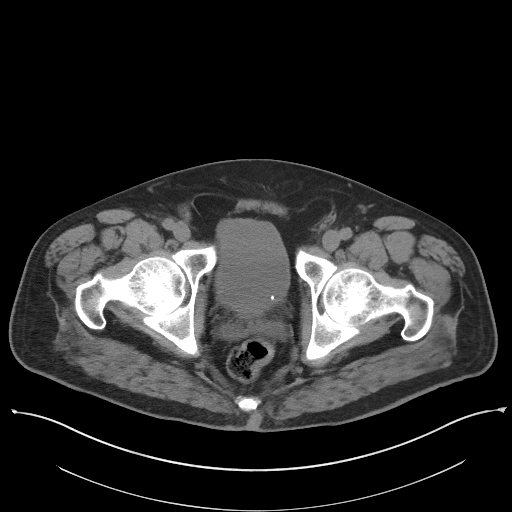
[im 35/110  soft-tissue]
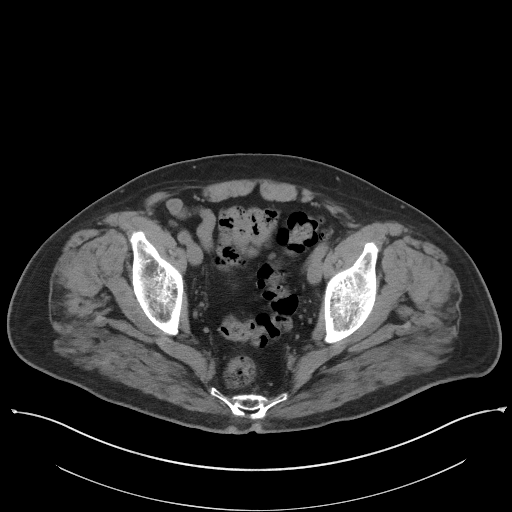
[im 44/110  soft-tissue]
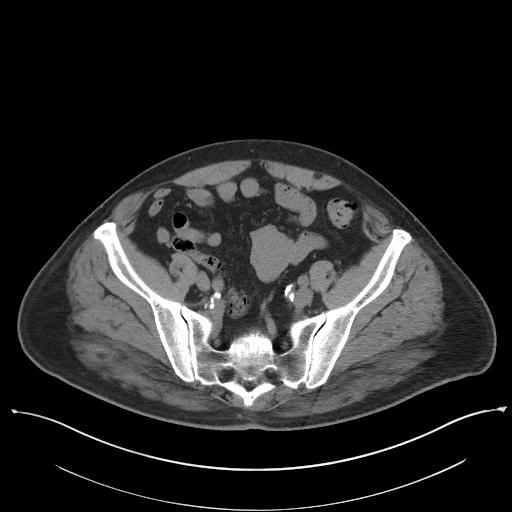
[im 53/110  soft-tissue]
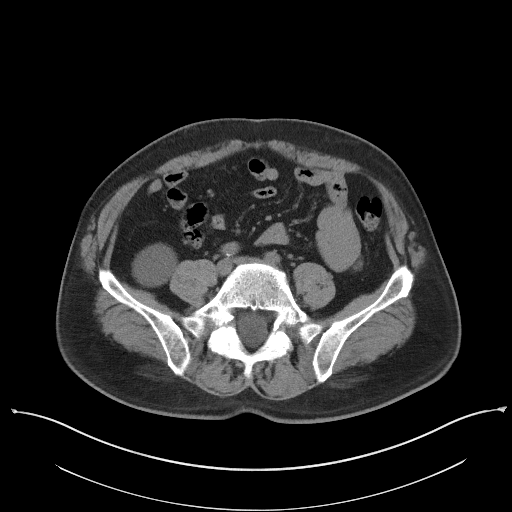
[im 62/110  soft-tissue]
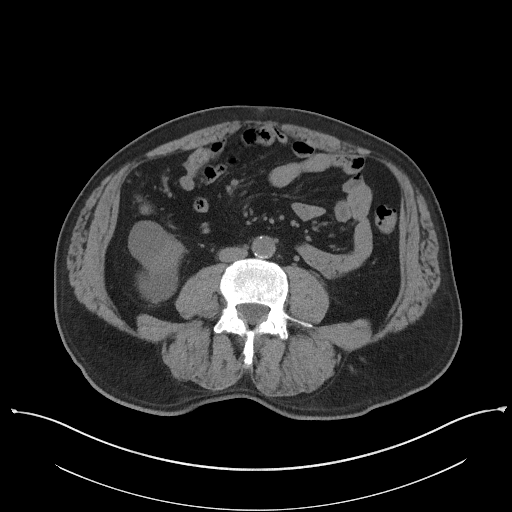
[im 70/110  soft-tissue]
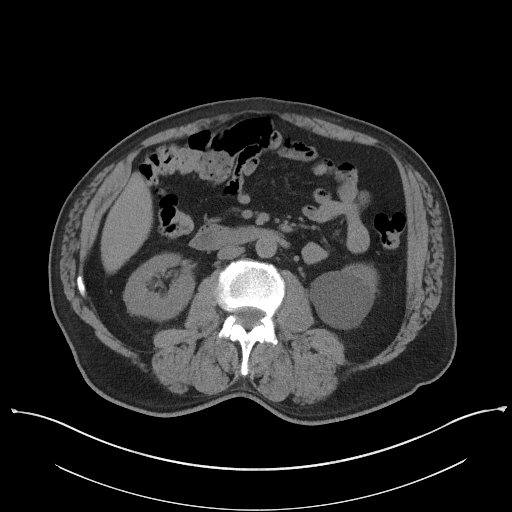
[im 79/110  soft-tissue]
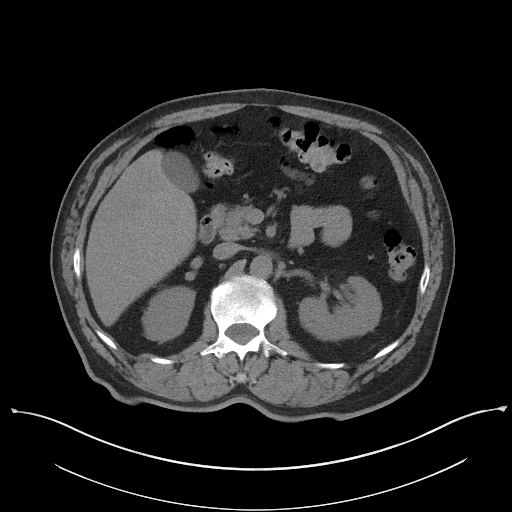
[im 79/110  bone]
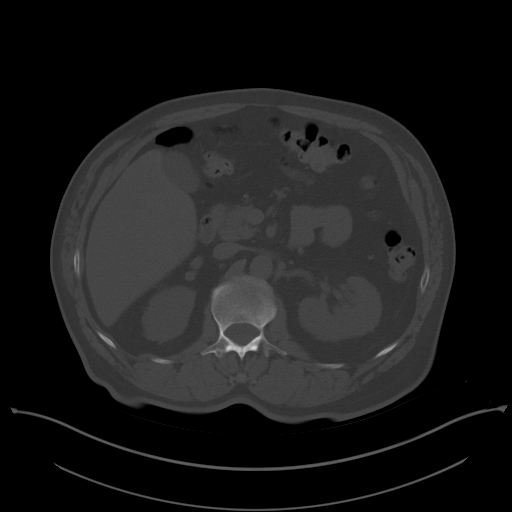
[im 88/110  soft-tissue]
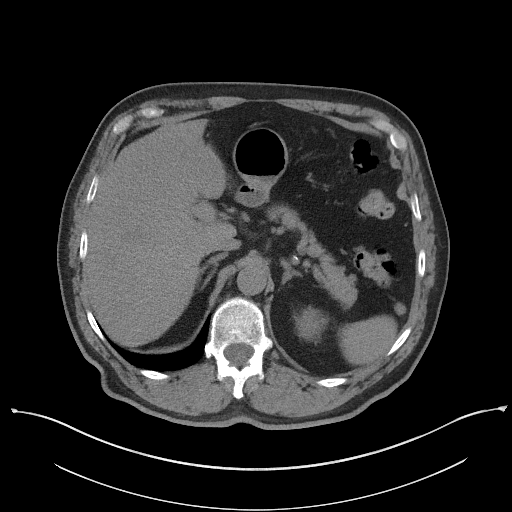
[im 96/110  soft-tissue]
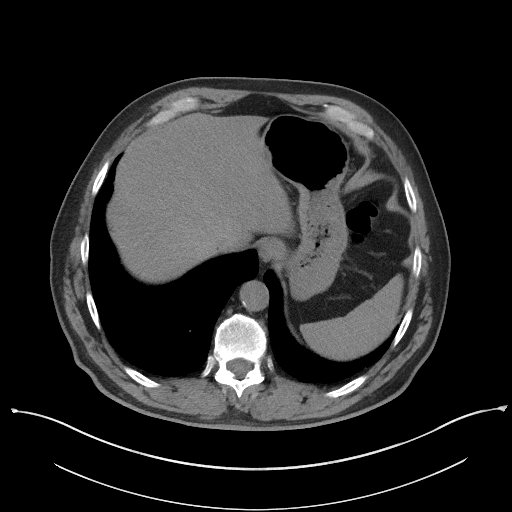
[im 105/110  soft-tissue]
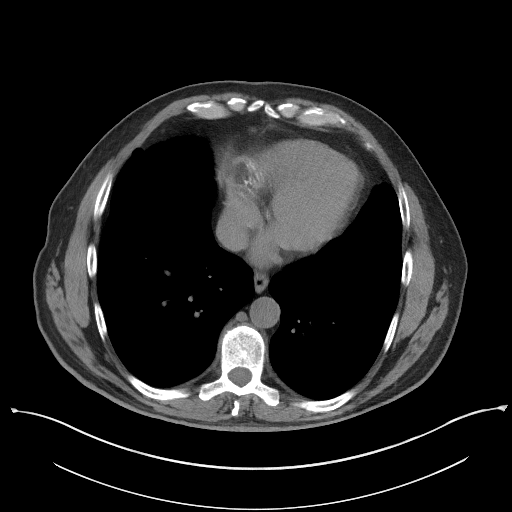

[Series 5: coronal · coronal · 0.84mm/px · 3 of 146 slices shown]
[im 49/146  soft-tissue]
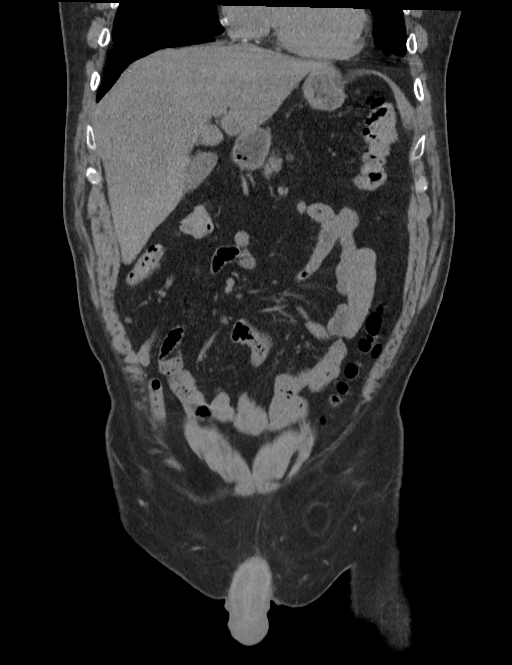
[im 65/146  soft-tissue]
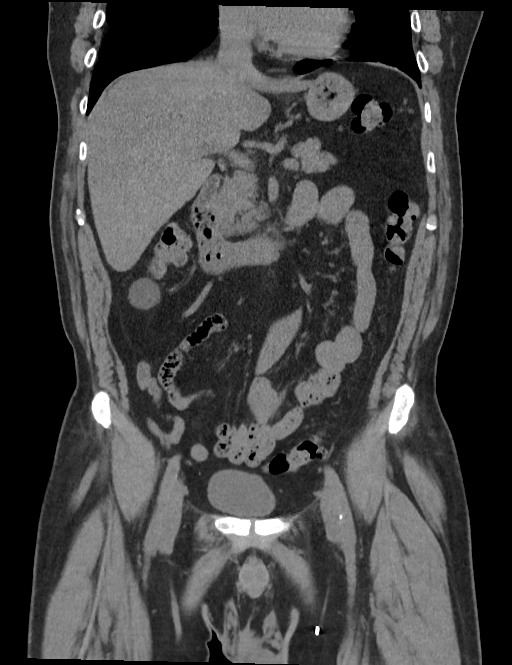
[im 81/146  soft-tissue]
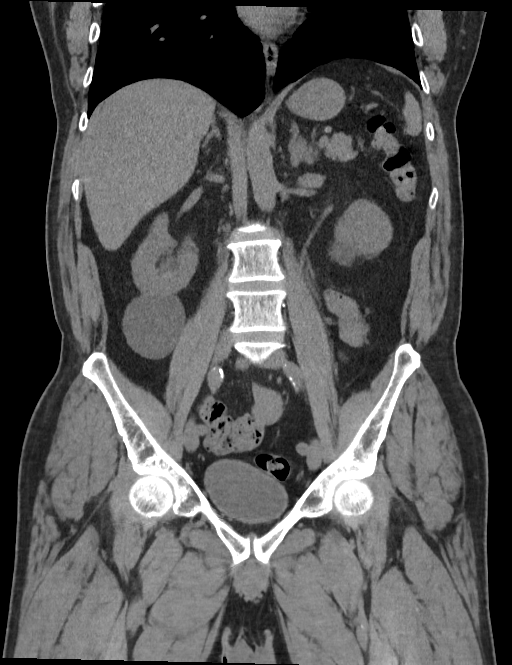

[15 of 46 positions shown; findings below may reference images not displayed]

FINDINGS: Lower chest: No acute pleural or parenchymal lung disease.

Unenhanced CT was performed per clinician order. Lack of IV contrast
limits sensitivity and specificity, especially for evaluation of
abdominal/pelvic solid viscera.

Hepatobiliary: Choose graphic decreased attenuation throughout the
liver consistent with hepatic steatosis. Indeterminate hypodensity
right lobe liver image [DATE] measuring 2.4 x 3.8 cm. Gallbladder is
unremarkable. No biliary dilation.

Pancreas: Unremarkable. No pancreatic ductal dilatation or
surrounding inflammatory changes.

Spleen: Normal in size without focal abnormality.

Adrenals/Urinary Tract: There are bilateral renal cortical cysts. 2
mm nonobstructing right renal calculus is seen image 38/2. No
left-sided calculi. No obstructive uropathy.

There is a 5 mm calculus within the urinary bladder. Faint
calcification is seen at the left UVJ measuring 3 mm, which could
reflect a nonobstructing left UVJ calculus. The remainder of the
bladder is unremarkable.

The adrenals are normal.

Stomach/Bowel: No bowel obstruction or ileus. Diverticulosis of the
sigmoid colon without diverticulitis. Normal appendix right lower
quadrant. No bowel wall thickening or inflammatory change.

Vascular/Lymphatic: Aortic atherosclerosis. No enlarged abdominal or
pelvic lymph nodes.

Reproductive: Prostate is unremarkable.

Other: No free fluid or free gas. Bilateral fat containing inguinal
hernias. No bowel herniation.

Musculoskeletal: No acute or destructive bony lesions. There is
bilateral spondylolysis of L5 with grade 1 anterolisthesis of L5 on
S1. Reconstructed images demonstrate no additional findings.
IMPRESSION: 1. Bladder calculi as above, with a 3 mm nonobstructing calculus at
the left UVJ and a 5 mm calculus within the bladder lumen.
2. A 2 mm nonobstructing right renal calculus.
3. Hepatic steatosis.
4. Indeterminate 3.8 cm right lobe liver hypodensity, which could
reflect a cyst or hemangioma. Dedicated liver MRI could be performed
for complete characterization.
5. Bilateral spondylolysis of L5 with grade 1 anterolisthesis of L5
on S1.
6. Aortic Atherosclerosis ([id number]).

## 2021-10-03 ENCOUNTER — Encounter: Payer: Self-pay | Admitting: Family Medicine

## 2021-10-08 DIAGNOSIS — N401 Enlarged prostate with lower urinary tract symptoms: Secondary | ICD-10-CM | POA: Diagnosis not present

## 2021-10-08 DIAGNOSIS — R82998 Other abnormal findings in urine: Secondary | ICD-10-CM | POA: Diagnosis not present

## 2021-10-08 DIAGNOSIS — Z8551 Personal history of malignant neoplasm of bladder: Secondary | ICD-10-CM | POA: Diagnosis not present

## 2021-10-08 DIAGNOSIS — Z79899 Other long term (current) drug therapy: Secondary | ICD-10-CM | POA: Diagnosis not present

## 2021-10-10 DIAGNOSIS — N401 Enlarged prostate with lower urinary tract symptoms: Secondary | ICD-10-CM | POA: Diagnosis not present

## 2021-10-10 DIAGNOSIS — Z1211 Encounter for screening for malignant neoplasm of colon: Secondary | ICD-10-CM | POA: Diagnosis not present

## 2021-10-14 ENCOUNTER — Ambulatory Visit: Payer: Medicare HMO

## 2021-10-17 LAB — COLOGUARD: COLOGUARD: NEGATIVE

## 2021-11-20 ENCOUNTER — Other Ambulatory Visit: Payer: Self-pay | Admitting: Family Medicine

## 2021-11-20 DIAGNOSIS — E119 Type 2 diabetes mellitus without complications: Secondary | ICD-10-CM

## 2021-11-20 NOTE — Telephone Encounter (Signed)
Refill request for metformin 500 mg tabs ? ?LOV - 10/22/20 ?Next OV - not scheduled ?Last refill - 04/15/21 #360/1 ? ?

## 2021-11-20 NOTE — Telephone Encounter (Signed)
Sent. Thanks.  ?Note put on rx re: OV.  ?

## 2022-01-27 ENCOUNTER — Ambulatory Visit (INDEPENDENT_AMBULATORY_CARE_PROVIDER_SITE_OTHER): Payer: Medicare HMO

## 2022-01-27 VITALS — Ht 74.0 in | Wt 221.0 lb

## 2022-01-27 DIAGNOSIS — Z0000 Encounter for general adult medical examination without abnormal findings: Secondary | ICD-10-CM

## 2022-01-27 NOTE — Progress Notes (Signed)
Subjective:   Garrett Woodard. is a 71 y.o. male who presents for Medicare Annual/Subsequent preventive examination.  Review of Systems    Virtual Visit via Telephone Note  I connected with  Garrett Woodard. on 01/27/22 at 10:30 AM EDT by telephone and verified that I am speaking with the correct person using two identifiers.  Location: Patient: Home Provider: Office Persons participating in the virtual visit: patient/Nurse Health Advisor   I discussed the limitations, risks, security and privacy concerns of performing an evaluation and management service by telephone and the availability of in person appointments. The patient expressed understanding and agreed to proceed.  Interactive audio and video telecommunications were attempted between this nurse and patient, however failed, due to patient having technical difficulties OR patient did not have access to video capability.  We continued and completed visit with audio only.  Some vital signs may be absent or patient reported.   Criselda Peaches, LPN  Cardiac Risk Factors include: advanced age (>43mn, >>76women);diabetes mellitus;hypertension;male gender     Objective:    Today's Vitals   01/27/22 1033  Weight: 221 lb (100.2 kg)  Height: '6\' 2"'$  (1.88 m)   Body mass index is 28.37 kg/m.     01/27/2022   10:46 AM 12/28/2020   12:35 PM 12/24/2020   12:24 PM 10/29/2020   12:53 PM 09/06/2020    5:17 PM  Advanced Directives  Does Patient Have a Medical Advance Directive? No No No No No  Would patient like information on creating a medical advance directive? No - Patient declined No - Patient declined No - Patient declined No - Patient declined     Current Medications (verified) Outpatient Encounter Medications as of 01/27/2022  Medication Sig   ferrous sulfate 325 (65 FE) MG tablet Take 1 tablet (325 mg total) by mouth daily with breakfast.   Hyoscyamine Sulfate SL (LEVSIN/SL) 0.125 MG SUBL Place 0.125 mg  under the tongue every 4 (four) hours as needed (bladder spasms, pain). 1-2 TABS   metFORMIN (GLUCOPHAGE) 500 MG tablet TAKE 2 TABLETS TWICE A DAY   Meth-Hyo-M Bl-Na Phos-Ph Sal (URIBEL) 118 MG CAPS Take 1 capsule (118 mg total) by mouth every 6 (six) hours as needed (dysuria).   metoprolol tartrate (LOPRESSOR) 25 MG tablet TAKE 1/2 TABLET BY MOUTH EVERY DAY   rosuvastatin (CRESTOR) 20 MG tablet TAKE 1 TABLET EVERY DAY   tamsulosin (FLOMAX) 0.4 MG CAPS capsule Take 0.4 mg by mouth daily.   No facility-administered encounter medications on file as of 01/27/2022.    Allergies (verified) Patient has no known allergies.   History: Past Medical History:  Diagnosis Date   Allergy    Asthma    cats   Coronary artery disease 10/04/2003   Cabg atrial septal defect repair//Cath ([facility] EF 70%, 3 vessell dz. (Dr.Callwood)10/04/2003   Diabetes mellitus    type 2   History of kidney stones    Hyperlipidemia    Hypertension    Renal disorder    Sleep apnea    Past Surgical History:  Procedure Laterality Date   CORONARY ANGIOPLASTY  10/04/2003   cath.  at ([facility]: EF70%, 3 vessell dz. Dr. CGwenlyn Saranwnl 11/16/2003// Exercise Cardiolite wnl EF 60% 12/03/2003   CORONARY ARTERY BYPASS GRAFT  10/2003   CABG atrial septal defect repair   CYSTOSCOPY WITH FULGERATION N/A 12/24/2020   Procedure: CYSTOSCOPY WITH FULGERATION;  Surgeon: WRoyston Cowper MD;  Location: [facility] ORS;  Service: Urology;  Laterality: N/A;   CYSTOSCOPY WITH FULGERATION N/A 12/27/2020   Procedure: CYSTOSCOPY WITH FULGERATION;  Surgeon: Royston Cowper, MD;  Location: [facility] ORS;  Service: Urology;  Laterality: N/A;   TRANSURETHRAL RESECTION OF BLADDER TUMOR N/A 11/07/2020   Procedure: TRANSURETHRAL RESECTION OF BLADDER TUMOR (TURBT) WITH MITOMYCIN;  Surgeon: Royston Cowper, MD;  Location: [facility] ORS;  Service: Urology;  Laterality: N/A;   Family History  Problem Relation Age of Onset   Stroke Mother    Hypertension Mother     Depression Mother    Heart disease Father 69       CABG x4   Cancer Father        lymphoma at age 3   Alcohol abuse Sister        knee replacement bilaterial disabled   Prostate cancer Neg Hx    Colon cancer Neg Hx    Social History   Socioeconomic History   Marital status: Married    Spouse name: Not on file   Number of children: Not on file   Years of education: Not on file   Highest education level: Not on file  Occupational History   Not on file  Tobacco Use   Smoking status: Former    Types: Cigarettes    Quit date: 10/04/2002    Years since quitting: 19.3   Smokeless tobacco: Never  Vaping Use   Vaping Use: Never used  Substance and Sexual Activity   Alcohol use: Yes    Alcohol/week: 0.0 standard drinks of alcohol    Comment: 5 to 6 drinks in a week   Drug use: No   Sexual activity: Not on file  Other Topics Concern   Not on file  Social History Narrative   [employer]   Likes golf, playing guitar, fishing   Retired.  Prev VP at [employer]   2 grandkids   Social Determinants of Health   Financial Resource Strain: Low Risk  (01/27/2022)   Overall Financial Resource Strain (CARDIA)    Difficulty of Paying Living Expenses: Not hard at all  Food Insecurity: No Food Insecurity (01/27/2022)   Hunger Vital Sign    Worried About Running Out of Food in the Last Year: Never true    Ran Out of Food in the Last Year: Never true  Transportation Needs: No Transportation Needs (01/27/2022)   PRAPARE - Hydrologist (Medical): No    Lack of Transportation (Non-Medical): No  Physical Activity: Sufficiently Active (01/27/2022)   Exercise Vital Sign    Days of Exercise per Week: 2 days    Minutes of Exercise per Session: 150+ min  Stress: No Stress Concern Present (01/27/2022)   [place]    Feeling of Stress : Not at all  Social Connections: Moderately Integrated (01/27/2022)    Social Connection and Isolation Panel [NHANES]    Frequency of Communication with Friends and Family: More than three times a week    Frequency of Social Gatherings with Friends and Family: More than three times a week    Attends Religious Services: Never    Marine scientist or Organizations: Yes    Attends Music therapist: More than 4 times per year    Marital Status: Married     Clinical Intake:   Diabetic?  Pre Diabetic  Interpreter Needed?: NoActivities of Daily Living    01/27/2022   10:44 AM  In your  present state of health, do you have any difficulty performing the following activities:  Hearing? 0  Vision? 0  Difficulty concentrating or making decisions? 0  Walking or climbing stairs? 0  Dressing or bathing? 0  Doing errands, shopping? 0  Preparing Food and eating ? N  Using the Toilet? N  In the past six months, have you accidently leaked urine? N  Do you have problems with loss of bowel control? N  Managing your Medications? N  Managing your Finances? N  Housekeeping or managing your Housekeeping? N    Patient Care Team: Tonia Ghent, MD as PCP - General Rockey Situ Kathlene November, MD as Consulting Physician (Cardiology)  Indicate any recent Medical Services you may have received from other than [facility] providers in the past year (date may be approximate).     Assessment:   This is a routine wellness examination for Garrett Woodard.  Hearing/Vision screen Hearing Screening - Comments:: No hearing difficulty Vision Screening - Comments:: No vision difficulty Followed by Dr Matilde Sprang  Dietary issues and exercise activities discussed: Exercise limited by: None identified   Goals Addressed               This Visit's Progress     Increase physical activity (pt-stated)        I would like to get back into the gym.       Depression Screen    01/27/2022   10:41 AM 06/25/2020    9:04 AM 03/12/2020   10:30 AM 05/29/2019    4:05 PM 05/24/2018     8:36 AM 05/10/2017   10:55 AM  PHQ 2/9 Scores  PHQ - 2 Score 0 0 0 0 0 0    Fall Risk    01/27/2022   10:44 AM 06/25/2020    9:04 AM 03/12/2020   10:30 AM 05/29/2019    4:05 PM 05/24/2018    8:36 AM  Fall Risk   Falls in the past year? 0 0 0 0 No  Number falls in past yr: 0 0     Injury with Fall? 0 0     Risk for fall due to : No Fall Risks No Fall Risks       FALL RISK PREVENTION PERTAINING TO THE HOME:  Any stairs in or around the home? Yes  If so, are there any without handrails? No  Home free of loose throw rugs in walkways, pet beds, electrical cords, etc? Yes  Adequate lighting in your home to reduce risk of falls? Yes   ASSISTIVE DEVICES UTILIZED TO PREVENT FALLS:  Life alert? No  Use of a cane, walker or w/c? No  Grab bars in the bathroom? No  Shower chair or bench in shower? No  Elevated toilet seat or a handicapped toilet? No   TIMED UP AND GO:  Was the test performed? No . Audio Visit  Cognitive Function:        01/27/2022   10:46 AM  6CIT Screen  What Year? 0 points  What month? 0 points  What time? 0 points  Count back from 20 0 points  Months in reverse 0 points  Repeat phrase 0 points  Total Score 0 points    Immunizations Immunization History  Administered Date(s) Administered   Fluad Quad(high Dose 65+) 06/25/2020   Influenza Split 06/03/2012   Influenza, Seasonal, Injecte, Preservative Fre 05/28/2015   Influenza-Unspecified 06/02/2014, 06/03/2018   PFIZER(Purple Top)SARS-COV-2 Vaccination 09/04/2019, 09/25/2019   Pneumococcal Polysaccharide-23 06/13/2013, 05/29/2019  Td 01/26/2005   Tdap 03/26/2015   Zoster, Live 02/13/2014    TDAP status: Up to date  Flu Vaccine status: Declined, Education has been provided regarding the importance of this vaccine but patient still declined. Advised may receive this vaccine at local pharmacy or Health Dept. Aware to provide a copy of the vaccination record if obtained from local pharmacy or  Health Dept. Verbalized acceptance and understanding.  Pneumococcal vaccine status: Due, Education has been provided regarding the importance of this vaccine. Advised may receive this vaccine at local pharmacy or Health Dept. Aware to provide a copy of the vaccination record if obtained from local pharmacy or Health Dept. Verbalized acceptance and understanding.  Covid-19 vaccine status: Completed vaccines  Qualifies for Shingles Vaccine? Yes   Zostavax completed No   Shingrix Completed?: No.    Education has been provided regarding the importance of this vaccine. Patient has been advised to call insurance company to determine out of pocket expense if they have not yet received this vaccine. Advised may also receive vaccine at local pharmacy or Health Dept. Verbalized acceptance and understanding.  Screening Tests Health Maintenance  Topic Date Due   FOOT EXAM  03/12/2021   HEMOGLOBIN A1C  04/24/2021   OPHTHALMOLOGY EXAM  08/29/2021   COVID-19 Vaccine (3 - Pfizer risk series) 02/12/2022 (Originally 10/23/2019)   URINE MICROALBUMIN  02/26/2022 (Originally 03/05/2021)   Zoster Vaccines- Shingrix (1 of 2) 04/29/2022 (Originally [birth year]01/1972)   Pneumonia Vaccine 20+ Years old (2 - PCV) 01/28/2023 (Originally 05/28/2020)   INFLUENZA VACCINE  03/03/2022   Fecal DNA (Cologuard)  10/10/2024   TETANUS/TDAP  03/25/2025   Hepatitis C Screening  Completed   HPV VACCINES  Aged Out    Health Maintenance  Health Maintenance Due  Topic Date Due   FOOT EXAM  03/12/2021   HEMOGLOBIN A1C  04/24/2021   OPHTHALMOLOGY EXAM  08/29/2021    Colorectal cancer screening: Type of screening: Cologuard. Completed 10/10/21. Repeat every 3 years  Lung Cancer Screening: (Low Dose CT Chest recommended if Age 69-80 years, 30 pack-year currently smoking OR have quit w/in 15years.) does not qualify.     Additional Screening:  Hepatitis C Screening: does qualify; Completed 07/16/15  Vision Screening: Recommended  annual ophthalmology exams for early detection of glaucoma and other disorders of the eye. Is the patient up to date with their annual eye exam?  Yes  Who is the provider or what is the name of the office in which the patient attends annual eye exams? Dr Matilde Sprang If pt is not established with a provider, would they like to be referred to a provider to establish care? No .   Dental Screening: Recommended annual dental exams for proper oral hygiene  Community Resource Referral / Chronic Care Management:  CRR required this visit?  No   CCM required this visit?  No      Plan:     I have personally reviewed and noted the following in the patient's chart:   Medical and social history Use of alcohol, tobacco or illicit drugs  Current medications and supplements including opioid prescriptions. Patient is not currently taking opioid prescriptions. Functional ability and status Nutritional status Physical activity Advanced directives List of other physicians Hospitalizations, surgeries, and ER visits in previous 12 months Vitals Screenings to include cognitive, depression, and falls Referrals and appointments  In addition, I have reviewed and discussed with patient certain preventive protocols, quality metrics, and best practice recommendations. A written personalized care plan for  preventive services as well as general preventive health recommendations were provided to patient.     Criselda Peaches, LPN   02/19/9197   Nurse Notes: Patient due labs Urine Microalbumin and Hemoglobin A1c. Also patient request discussion concerning advised diabetic medication changes.

## 2022-01-28 ENCOUNTER — Telehealth: Payer: Self-pay | Admitting: Family Medicine

## 2022-01-28 NOTE — Telephone Encounter (Signed)
Please call and schedule DM2 f/u with me.  We can do labs at the visit if he comes in fasting.  Thanks.

## 2022-01-29 NOTE — Telephone Encounter (Signed)
Patient is scheduled   

## 2022-02-01 ENCOUNTER — Other Ambulatory Visit: Payer: Self-pay | Admitting: Family Medicine

## 2022-02-01 DIAGNOSIS — E119 Type 2 diabetes mellitus without complications: Secondary | ICD-10-CM

## 2022-02-02 ENCOUNTER — Other Ambulatory Visit (INDEPENDENT_AMBULATORY_CARE_PROVIDER_SITE_OTHER): Payer: Medicare HMO

## 2022-02-02 DIAGNOSIS — E119 Type 2 diabetes mellitus without complications: Secondary | ICD-10-CM | POA: Diagnosis not present

## 2022-02-02 LAB — COMPREHENSIVE METABOLIC PANEL
ALT: 30 U/L (ref 0–53)
AST: 17 U/L (ref 0–37)
Albumin: 4.5 g/dL (ref 3.5–5.2)
Alkaline Phosphatase: 75 U/L (ref 39–117)
BUN: 18 mg/dL (ref 6–23)
CO2: 25 mEq/L (ref 19–32)
Calcium: 9.6 mg/dL (ref 8.4–10.5)
Chloride: 100 mEq/L (ref 96–112)
Creatinine, Ser: 1.18 mg/dL (ref 0.40–1.50)
GFR: 63.26 mL/min (ref >60.00)
Glucose, Bld: 255 mg/dL — ABNORMAL HIGH (ref 70–99)
Potassium: 4.7 mEq/L (ref 3.5–5.1)
Sodium: 136 mEq/L (ref 135–145)
Total Bilirubin: 0.7 mg/dL (ref 0.2–1.2)
Total Protein: 7.5 g/dL (ref 6.0–8.3)

## 2022-02-02 LAB — LIPID PANEL
Cholesterol: 155 mg/dL (ref 0–200)
HDL: 58.6 mg/dL (ref >39.00)
LDL Cholesterol: 58 mg/dL (ref 0–99)
NonHDL: 96.3
Total CHOL/HDL Ratio: 3
Triglycerides: 192 mg/dL — ABNORMAL HIGH (ref 0.0–149.0)
VLDL: 38.4 mg/dL (ref 0.0–40.0)

## 2022-02-02 LAB — CBC WITH DIFFERENTIAL/PLATELET
Basophils Absolute: 0.1 10*3/uL (ref 0.0–0.1)
Basophils Relative: 0.7 % (ref 0.0–3.0)
Eosinophils Absolute: 0.5 10*3/uL (ref 0.0–0.7)
Eosinophils Relative: 6.9 % — ABNORMAL HIGH (ref 0.0–5.0)
HCT: 44.2 % (ref 39.0–52.0)
Hemoglobin: 15 g/dL (ref 13.0–17.0)
Lymphocytes Relative: 28.2 % (ref 12.0–46.0)
Lymphs Abs: 2.1 10*3/uL (ref 0.7–4.0)
MCHC: 33.9 g/dL (ref 30.0–36.0)
MCV: 90.6 fl (ref 78.0–100.0)
Monocytes Absolute: 0.5 10*3/uL (ref 0.1–1.0)
Monocytes Relative: 6.5 % (ref 3.0–12.0)
Neutro Abs: 4.3 10*3/uL (ref 1.4–7.7)
Neutrophils Relative %: 57.7 % (ref 43.0–77.0)
Platelets: 287 10*3/uL (ref 150.0–400.0)
RBC: 4.88 Mil/uL (ref 4.22–5.81)
RDW: 12.9 % (ref 11.5–15.5)
WBC: 7.4 10*3/uL (ref 4.0–10.5)

## 2022-02-02 LAB — MICROALBUMIN / CREATININE URINE RATIO
Creatinine,U: 234.2 mg/dL
Microalb Creat Ratio: 1.5 mg/g (ref 0.0–30.0)
Microalb, Ur: 3.5 mg/dL — ABNORMAL HIGH (ref 0.0–1.9)

## 2022-02-02 LAB — HEMOGLOBIN A1C: Hgb A1c MFr Bld: 8.9 % — ABNORMAL HIGH (ref 4.6–6.5)

## 2022-02-05 ENCOUNTER — Encounter: Payer: Self-pay | Admitting: Family Medicine

## 2022-02-05 ENCOUNTER — Ambulatory Visit (INDEPENDENT_AMBULATORY_CARE_PROVIDER_SITE_OTHER): Payer: Medicare HMO | Admitting: Family Medicine

## 2022-02-05 DIAGNOSIS — Z951 Presence of aortocoronary bypass graft: Secondary | ICD-10-CM

## 2022-02-05 DIAGNOSIS — I25118 Atherosclerotic heart disease of native coronary artery with other forms of angina pectoris: Secondary | ICD-10-CM

## 2022-02-05 DIAGNOSIS — E7800 Pure hypercholesterolemia, unspecified: Secondary | ICD-10-CM

## 2022-02-05 DIAGNOSIS — R809 Proteinuria, unspecified: Secondary | ICD-10-CM

## 2022-02-05 DIAGNOSIS — E1129 Type 2 diabetes mellitus with other diabetic kidney complication: Secondary | ICD-10-CM

## 2022-02-05 NOTE — Progress Notes (Signed)
Diabetes:  Using medications without difficulties:yes Hypoglycemic episodes:no sx Hyperglycemic episodes: no sx Feet problems: no Blood Sugars averaging: Not checked. A1c up and MALB positive.  Discussed with patient about ACE inhibitor use. He was taking metformin 4 tabs in the AM, d/w pt about taking 2 tabs BID.    Elevated Cholesterol: Using medications without problems:yes Muscle aches: no Diet compliance: yes Exercise:yes  His brother and father both passed away with coronary disease.  Condolences offered.  He will call about follow-up with cardiology.  Discussed.  Bladder f/u per urology, d/w pt.    PMH and SH reviewed  Meds, vitals, and allergies reviewed.   ROS: Per HPI unless specifically indicated in ROS section   GEN: nad, alert and oriented HEENT: ncat NECK: supple w/o LA CV: rrr. PULM: ctab, no inc wob ABD: soft, +bs EXT: no edema SKIN: no acute rash  Diabetic foot exam: Normal inspection No skin breakdown No calluses  Normal DP pulses Normal sensation to light touch and monofilament Nails normal

## 2022-02-08 ENCOUNTER — Other Ambulatory Visit: Payer: Self-pay | Admitting: Family Medicine

## 2022-02-08 ENCOUNTER — Encounter: Payer: Self-pay | Admitting: Family Medicine

## 2022-02-08 DIAGNOSIS — E7800 Pure hypercholesterolemia, unspecified: Secondary | ICD-10-CM

## 2022-02-08 DIAGNOSIS — E119 Type 2 diabetes mellitus without complications: Secondary | ICD-10-CM

## 2022-02-08 DIAGNOSIS — I10 Essential (primary) hypertension: Secondary | ICD-10-CM

## 2022-02-08 MED ORDER — METFORMIN HCL 500 MG PO TABS: ORAL_TABLET | ORAL | 3 refills | Status: DC

## 2022-02-08 MED ORDER — METOPROLOL TARTRATE 25 MG PO TABS: 12.5000 mg | ORAL_TABLET | Freq: Every day | ORAL | 3 refills | Status: DC

## 2022-02-08 MED ORDER — ROSUVASTATIN CALCIUM 20 MG PO TABS: 20.0000 mg | ORAL_TABLET | Freq: Every day | ORAL | 3 refills | Status: DC

## 2022-02-08 MED ORDER — LISINOPRIL 2.5 MG PO TABS: 2.5000 mg | ORAL_TABLET | Freq: Every day | ORAL | 3 refills | Status: DC

## 2022-02-08 NOTE — Progress Notes (Signed)
Notify patient.  He was on lisinopril in the past.  It should be okay to restart 2.5 mg a day.  Let me know if not tolerated.  I sent his other refills in the meantime.  Thanks.

## 2022-02-08 NOTE — Assessment & Plan Note (Signed)
No chest pain.  I asked him to call about follow-up with cardiology.

## 2022-02-08 NOTE — Assessment & Plan Note (Signed)
Continue Crestor and continue work on diet and exercise.

## 2022-02-08 NOTE — Assessment & Plan Note (Signed)
I asked him to call about cardiology follow-up.  Discussed.

## 2022-02-08 NOTE — Assessment & Plan Note (Signed)
See following phone note.  I went to check back on his records about previous lisinopril use/restart.  Change metformin to 2 tabs twice a day to see if that helps control her sugar better.  Plan on recheck in 3 months.  Continue work on diet and exercise.

## 2022-02-09 NOTE — Progress Notes (Signed)
Patient called back and has been notified

## 2022-02-09 NOTE — Progress Notes (Signed)
LMTCB

## 2022-02-19 ENCOUNTER — Other Ambulatory Visit: Payer: Self-pay

## 2022-02-19 MED ORDER — LISINOPRIL 2.5 MG PO TABS: 2.5000 mg | ORAL_TABLET | Freq: Every day | ORAL | 3 refills | Status: DC

## 2022-03-20 ENCOUNTER — Other Ambulatory Visit: Payer: Self-pay

## 2022-03-20 NOTE — Patient Outreach (Signed)
[facility]) Care Management  03/20/2022  Argus Caraher. Oct 23, 1950 [id number]   Telephone Screen    Outreach call to patient to introduce [facility] services and assess care needs as part of benefit of PCP office and insurance plan. No answer. RN CM left HIPAA compliant voicemail message along with contact info.      Plan: RN CM will make outreach attempt to patient within 4 business days. RN CM will send unsuccessful outreach letter to patient.  Enzo Montgomery, RN,BSN,CCM [place] [facility] Telephonic Care Management Coordinator Direct Phone: [phone] Toll Free: [phone] Fax: [phone]

## 2022-03-23 ENCOUNTER — Other Ambulatory Visit: Payer: Self-pay

## 2022-03-23 NOTE — Patient Outreach (Signed)
Garrett Woodard) Care Management  03/23/2022  Garrett Woodard. March 27, 1951 [id number]     Telephone Screen       Voicemail received from patient returning RN CM call. Return call to patient. He is doing well. Denies any RN CM needs or concerns at this time.    Main healthcare issue/concern today: None reported. Patient is independent with ADLs/IADLs. Drives himself to medical appts. No issues with meds-he has good pharmacy benefit with Medicare plan and uses mail order.    Health Maintenance/Care Gaps: -Last AWV: 01/27/22-completed per [place] -Vaccinations: Patient plans to call PCP office to schedule an appt    Garrett Montgomery, RN,BSN,CCM [place] [facility] Telephonic Care Management Coordinator Direct Phone: [phone] Toll Free: [phone] Fax: [phone]

## 2022-04-13 DIAGNOSIS — N401 Enlarged prostate with lower urinary tract symptoms: Secondary | ICD-10-CM | POA: Diagnosis not present

## 2022-04-13 DIAGNOSIS — N200 Calculus of kidney: Secondary | ICD-10-CM | POA: Diagnosis not present

## 2022-04-13 DIAGNOSIS — R82998 Other abnormal findings in urine: Secondary | ICD-10-CM | POA: Diagnosis not present

## 2022-04-13 DIAGNOSIS — Z8551 Personal history of malignant neoplasm of bladder: Secondary | ICD-10-CM | POA: Diagnosis not present

## 2022-04-16 ENCOUNTER — Other Ambulatory Visit: Payer: Self-pay | Admitting: Family Medicine

## 2022-04-16 DIAGNOSIS — E119 Type 2 diabetes mellitus without complications: Secondary | ICD-10-CM

## 2022-04-16 DIAGNOSIS — E7800 Pure hypercholesterolemia, unspecified: Secondary | ICD-10-CM

## 2022-05-11 NOTE — Progress Notes (Unsigned)
Cardiology Office Note  Date:  05/11/2022   ID:  Garrett Woodard., DOB October 02, 1950, MRN [id number]  PCP:  Tonia Ghent, MD   No chief complaint on file.   HPI:  [name] is a 71 year old gentleman with  smoking history, stopped in 2005,  coronary artery disease with CABG in 2005 , s/p ASD ,  Previous anginal sx with nausea, ache in jaw, tingling in arm obstructive sleep apnea, on CPAP,  hyperlipidemia,  diabetes type 2 with hemoglobin A1c greater than 8,  who presents for follow-up of his coronary artery disease Hobbies include fishing and musician   Last seen by myself in clinic September 2021    Retired last year Going to [employer] Active, fishing  Father died 2 months ago  HBA1C 7.0, ran up to 9.0 Was drinking milkshakes  No chest pain, no SOB   Lab Results  Component Value Date   CHOL 155 02/02/2022   HDL 58.60 02/02/2022   LDLCALC 58 02/02/2022   TRIG 192.0 (H) 02/02/2022    EKG personally reviewed by myself on todays visit Shows nsr rate 89 bpm no ST or T wave changes   Other past medical history No recent stress test available Prior cardiac catheterization in 2005 not available. This was done at [facility]    PMH:   has a past medical history of Allergy, Asthma, Coronary artery disease (10/04/2003), Diabetes mellitus, History of kidney stones, Hyperlipidemia, Hypertension, Renal disorder, and Sleep apnea.  PSH:    Past Surgical History:  Procedure Laterality Date   CORONARY ANGIOPLASTY  10/04/2003   cath.  at [facility]): EF70%, 3 vessell dz. Dr. Gwenlyn Saran wnl 11/16/2003// Exercise Cardiolite wnl EF 60% 12/03/2003   CORONARY ARTERY BYPASS GRAFT  10/2003   CABG atrial septal defect repair   CYSTOSCOPY WITH FULGERATION N/A 12/24/2020   Procedure: CYSTOSCOPY WITH FULGERATION;  Surgeon: Royston Cowper, MD;  Location: [facility] ORS;  Service: Urology;  Laterality: N/A;   CYSTOSCOPY WITH FULGERATION N/A 12/27/2020   Procedure: CYSTOSCOPY WITH  FULGERATION;  Surgeon: Royston Cowper, MD;  Location: [facility] ORS;  Service: Urology;  Laterality: N/A;   TRANSURETHRAL RESECTION OF BLADDER TUMOR N/A 11/07/2020   Procedure: TRANSURETHRAL RESECTION OF BLADDER TUMOR (TURBT) WITH MITOMYCIN;  Surgeon: Royston Cowper, MD;  Location: [facility] ORS;  Service: Urology;  Laterality: N/A;    Current Outpatient Medications  Medication Sig Dispense Refill   lisinopril (ZESTRIL) 2.5 MG tablet Take 1 tablet (2.5 mg total) by mouth daily. 90 tablet 3   metFORMIN (GLUCOPHAGE) 500 MG tablet TAKE 2 TABLETS TWICE A DAY (NEED MD APPOINTMENT) 360 tablet 3   metoprolol tartrate (LOPRESSOR) 25 MG tablet Take 0.5 tablets (12.5 mg total) by mouth daily. 45 tablet 3   rosuvastatin (CRESTOR) 20 MG tablet TAKE 1 TABLET EVERY DAY 90 tablet 3   tamsulosin (FLOMAX) 0.4 MG CAPS capsule Take 0.4 mg by mouth daily.     No current facility-administered medications for this visit.     Allergies:   Patient has no known allergies.   Social History:  The patient  reports that he quit smoking about 19 years ago. His smoking use included cigarettes. He has never used smokeless tobacco. He reports current alcohol use. He reports that he does not use drugs.   Family History:   family history includes Alcohol abuse in his sister; Cancer in his father; Depression in his mother; Heart disease (age of onset: 91) in his father; Hypertension in  his mother; Stroke in his mother.    Review of Systems: Review of Systems  Constitutional: Negative.   Respiratory: Negative.    Cardiovascular: Negative.   Gastrointestinal: Negative.   Musculoskeletal: Negative.   Neurological: Negative.   Psychiatric/Behavioral: Negative.    All other systems reviewed and are negative. No change in his review of systems   PHYSICAL EXAM: VS:  There were no vitals taken for this visit. , BMI There is no height or weight on file to calculate BMI. Constitutional:  oriented to person, place, and time. No  distress.  HENT:  Head: Grossly normal Eyes:  no discharge. No scleral icterus.  Neck: No JVD, no carotid bruits  Cardiovascular: Regular rate and rhythm, no murmurs appreciated Pulmonary/Chest: Clear to auscultation bilaterally, no wheezes or rails Abdominal: Soft.  no distension.  no tenderness.  Musculoskeletal: Normal range of motion Neurological:  normal muscle tone. Coordination normal. No atrophy Skin: Skin warm and dry Psychiatric: normal affect, pleasant   Recent Labs: 02/02/2022: ALT 30; BUN 18; Creatinine, Ser 1.18; Hemoglobin 15.0; Platelets 287.0; Potassium 4.7; Sodium 136    Lipid Panel Lab Results  Component Value Date   CHOL 155 02/02/2022   HDL 58.60 02/02/2022   LDLCALC 58 02/02/2022   TRIG 192.0 (H) 02/02/2022      Wt Readings from Last 3 Encounters:  02/05/22 223 lb (101.2 kg)  01/27/22 221 lb (100.2 kg)  12/27/20 218 lb (98.9 kg)       ASSESSMENT AND PLAN:  Essential hypertension, benign Blood pressure is well controlled on today's visit. No changes made to the medications.  Mixed hyperlipidemia Numbers at goal, no medication changes made  Coronary artery disease of native artery of native heart with stable angina pectoris (HCC) Currently with no symptoms of angina. No further workup at this time. Continue current medication regimen. Stressed importance of aggressive diabetes control  Uncontrolled type 2 diabetes mellitus with other circulatory complication, without long-term current use of insulin (HCC) Hemoglobin A1c 9.0 Milkshakes, has now changed diet  Sleep apnea, unspecified type Keep weight low  S/P CABG (coronary artery bypass graft)  previous surgery 2005 denies angina     Total encounter time more than 25 minutes  Greater than 50% was spent in counseling and coordination of care with the patient    No orders of the defined types were placed in this encounter.    Signed, Esmond Plants, M.D., Ph.D. 05/11/2022  [place], [place]

## 2022-05-12 ENCOUNTER — Ambulatory Visit: Payer: Medicare HMO | Attending: Cardiovascular Disease | Admitting: Cardiovascular Disease

## 2022-05-12 ENCOUNTER — Encounter: Payer: Self-pay | Admitting: Cardiovascular Disease

## 2022-05-12 VITALS — BP 130/66 | HR 80 | Ht 74.0 in | Wt 221.2 lb

## 2022-05-12 DIAGNOSIS — E7800 Pure hypercholesterolemia, unspecified: Secondary | ICD-10-CM | POA: Diagnosis not present

## 2022-05-12 DIAGNOSIS — I10 Essential (primary) hypertension: Secondary | ICD-10-CM

## 2022-05-12 DIAGNOSIS — I25118 Atherosclerotic heart disease of native coronary artery with other forms of angina pectoris: Secondary | ICD-10-CM | POA: Diagnosis not present

## 2022-05-12 NOTE — Patient Instructions (Signed)
Medication Instructions:  No changes  If you need a refill on your cardiac medications before your next appointment, please call your pharmacy.   Lab work: No new labs needed  Testing/Procedures: No new testing needed  Follow-Up: At [facility], you and your health needs are our priority.  As part of our continuing mission to provide you with exceptional heart care, we have created designated Provider Care Teams.  These Care Teams include your primary Cardiologist (physician) and Advanced Practice Providers (APPs -  Physician Assistants and Nurse Practitioners) who all work together to provide you with the care you need, when you need it.  You will need a follow up appointment in 12 months  Providers on your designated Care Team:   Christopher Berge, NP Ryan Dunn, PA-C Cadence Furth, PA-C  COVID-19 Vaccine Information can be found at: [email or web address] For questions related to vaccine distribution or appointments, please email [email or web address] or call [phone].   

## 2022-05-27 ENCOUNTER — Other Ambulatory Visit: Payer: Self-pay | Admitting: Family Medicine

## 2022-05-27 DIAGNOSIS — I10 Essential (primary) hypertension: Secondary | ICD-10-CM

## 2022-09-23 DIAGNOSIS — Z7984 Long term (current) use of oral hypoglycemic drugs: Secondary | ICD-10-CM | POA: Diagnosis not present

## 2022-09-23 DIAGNOSIS — Z961 Presence of intraocular lens: Secondary | ICD-10-CM | POA: Diagnosis not present

## 2022-09-23 DIAGNOSIS — E119 Type 2 diabetes mellitus without complications: Secondary | ICD-10-CM | POA: Diagnosis not present

## 2022-09-23 LAB — HM DIABETES EYE EXAM

## 2022-10-06 ENCOUNTER — Other Ambulatory Visit: Payer: Self-pay

## 2022-10-06 MED ORDER — LISINOPRIL 2.5 MG PO TABS: 2.5000 mg | ORAL_TABLET | Freq: Every day | ORAL | 3 refills | Status: DC

## 2023-02-02 ENCOUNTER — Ambulatory Visit (INDEPENDENT_AMBULATORY_CARE_PROVIDER_SITE_OTHER): Payer: Medicare HMO

## 2023-02-02 ENCOUNTER — Telehealth: Payer: Self-pay

## 2023-02-02 VITALS — Ht 74.0 in | Wt 212.0 lb

## 2023-02-02 DIAGNOSIS — Z0000 Encounter for general adult medical examination without abnormal findings: Secondary | ICD-10-CM

## 2023-02-02 NOTE — Telephone Encounter (Signed)
-----   Message from Joaquim Nam, MD sent at 02/02/2023  1:36 PM EDT ----- Please schedule f/u with labs ahead of time.  Thanks.   Clelia Croft

## 2023-02-02 NOTE — Patient Instructions (Addendum)
Garrett Woodard , Thank you for taking time to come for your Medicare Wellness Visit. I appreciate your ongoing commitment to your health goals. Please review the following plan we discussed and let me know if I can assist you in the future.   These are the goals we discussed:  Goals       Increase physical activity (pt-stated)      Stay Active!        This is a list of the screening recommended for you and due dates:  Health Maintenance  Topic Date Due   Zoster (Shingles) Vaccine (1 of 2) Never done   COVID-19 Vaccine (3 - Pfizer risk series) 10/23/2019   Pneumonia Vaccine (2 of 2 - PCV) 05/28/2020   Hemoglobin A1C  08/05/2022   Yearly kidney function blood test for diabetes  02/03/2023   Yearly kidney health urinalysis for diabetes  02/03/2023   Complete foot exam   02/06/2023   Flu Shot  03/04/2023   Eye exam for diabetics  09/24/2023   Medicare Annual Wellness Visit  02/02/2024   Cologuard (Stool DNA test)  10/10/2024   DTaP/Tdap/Td vaccine (3 - Td or Tdap) 03/25/2025   Hepatitis C Screening  Completed   HPV Vaccine  Aged Out    Advanced directives: Advance directive discussed with you today. Even though you declined this today, please call our office should you change your mind, and we can give you the proper paperwork for you to fill out.   Conditions/risks identified: None  Next appointment: Follow up in one year for your annual wellness visit.    Preventive Care 46 Years and Older, Male  Preventive care refers to lifestyle choices and visits with your health care provider that can promote health and wellness. What does preventive care include? A yearly physical exam. This is also called an annual well check. Dental exams once or twice a year. Routine eye exams. Ask your health care provider how often you should have your eyes checked. Personal lifestyle choices, including: Daily care of your teeth and gums. Regular physical activity. Eating a healthy diet. Avoiding  tobacco and drug use. Limiting alcohol use. Practicing safe sex. Taking low doses of aspirin every day. Taking vitamin and mineral supplements as recommended by your health care provider. What happens during an annual well check? The services and screenings done by your health care provider during your annual well check will depend on your age, overall health, lifestyle risk factors, and family history of disease. Counseling  Your health care provider may ask you questions about your: Alcohol use. Tobacco use. Drug use. Emotional well-being. Home and relationship well-being. Sexual activity. Eating habits. History of falls. Memory and ability to understand (cognition). Work and work Astronomer. Screening  You may have the following tests or measurements: Height, weight, and BMI. Blood pressure. Lipid and cholesterol levels. These may be checked every 5 years, or more frequently if you are over 74 years old. Skin check. Lung cancer screening. You may have this screening every year starting at age 53 if you have a 30-pack-year history of smoking and currently smoke or have quit within the past 15 years. Fecal occult blood test (FOBT) of the stool. You may have this test every year starting at age 31. Flexible sigmoidoscopy or colonoscopy. You may have a sigmoidoscopy every 5 years or a colonoscopy every 10 years starting at age 59. Prostate cancer screening. Recommendations will vary depending on your family history and other risks. Hepatitis C blood  test. Hepatitis B blood test. Sexually transmitted disease (STD) testing. Diabetes screening. This is done by checking your blood sugar (glucose) after you have not eaten for a while (fasting). You may have this done every 1-3 years. Abdominal aortic aneurysm (AAA) screening. You may need this if you are a current or former smoker. Osteoporosis. You may be screened starting at age 3 if you are at high risk. Talk with your health care  provider about your test results, treatment options, and if necessary, the need for more tests. Vaccines  Your health care provider may recommend certain vaccines, such as: Influenza vaccine. This is recommended every year. Tetanus, diphtheria, and acellular pertussis (Tdap, Td) vaccine. You may need a Td booster every 10 years. Zoster vaccine. You may need this after age 52. Pneumococcal 13-valent conjugate (PCV13) vaccine. One dose is recommended after age 33. Pneumococcal polysaccharide (PPSV23) vaccine. One dose is recommended after age 47. Talk to your health care provider about which screenings and vaccines you need and how often you need them. This information is not intended to replace advice given to you by your health care provider. Make sure you discuss any questions you have with your health care provider. Document Released: 08/16/2015 Document Revised: 04/08/2016 Document Reviewed: 05/21/2015 Elsevier Interactive Patient Education  2017 [employer].  Fall Prevention in the Home Falls can cause injuries. They can happen to people of all ages. There are many things you can do to make your home safe and to help prevent falls. What can I do on the outside of my home? Regularly fix the edges of walkways and driveways and fix any cracks. Remove anything that might make you trip as you walk through a door, such as a raised step or threshold. Trim any bushes or trees on the path to your home. Use bright outdoor lighting. Clear any walking paths of anything that might make someone trip, such as rocks or tools. Regularly check to see if handrails are loose or broken. Make sure that both sides of any steps have handrails. Any raised decks and porches should have guardrails on the edges. Have any leaves, snow, or ice cleared regularly. Use sand or salt on walking paths during winter. Clean up any spills in your garage right away. This includes oil or grease spills. What can I do in the  bathroom? Use night lights. Install grab bars by the toilet and in the tub and shower. Do not use towel bars as grab bars. Use non-skid mats or decals in the tub or shower. If you need to sit down in the shower, use a plastic, non-slip stool. Keep the floor dry. Clean up any water that spills on the floor as soon as it happens. Remove soap buildup in the tub or shower regularly. Attach bath mats securely with double-sided non-slip rug tape. Do not have throw rugs and other things on the floor that can make you trip. What can I do in the bedroom? Use night lights. Make sure that you have a light by your bed that is easy to reach. Do not use any sheets or blankets that are too big for your bed. They should not hang down onto the floor. Have a firm chair that has side arms. You can use this for support while you get dressed. Do not have throw rugs and other things on the floor that can make you trip. What can I do in the kitchen? Clean up any spills right away. Avoid walking on wet  floors. Keep items that you use a lot in easy-to-reach places. If you need to reach something above you, use a strong step stool that has a grab bar. Keep electrical cords out of the way. Do not use floor polish or wax that makes floors slippery. If you must use wax, use non-skid floor wax. Do not have throw rugs and other things on the floor that can make you trip. What can I do with my stairs? Do not leave any items on the stairs. Make sure that there are handrails on both sides of the stairs and use them. Fix handrails that are broken or loose. Make sure that handrails are as long as the stairways. Check any carpeting to make sure that it is firmly attached to the stairs. Fix any carpet that is loose or worn. Avoid having throw rugs at the top or bottom of the stairs. If you do have throw rugs, attach them to the floor with carpet tape. Make sure that you have a light switch at the top of the stairs and the  bottom of the stairs. If you do not have them, ask someone to add them for you. What else can I do to help prevent falls? Wear shoes that: Do not have high heels. Have rubber bottoms. Are comfortable and fit you well. Are closed at the toe. Do not wear sandals. If you use a stepladder: Make sure that it is fully opened. Do not climb a closed stepladder. Make sure that both sides of the stepladder are locked into place. Ask someone to hold it for you, if possible. Clearly mark and make sure that you can see: Any grab bars or handrails. First and last steps. Where the edge of each step is. Use tools that help you move around (mobility aids) if they are needed. These include: Canes. Walkers. Scooters. Crutches. Turn on the lights when you go into a dark area. Replace any light bulbs as soon as they burn out. Set up your furniture so you have a clear path. Avoid moving your furniture around. If any of your floors are uneven, fix them. If there are any pets around you, be aware of where they are. Review your medicines with your doctor. Some medicines can make you feel dizzy. This can increase your chance of falling. Ask your doctor what other things that you can do to help prevent falls. This information is not intended to replace advice given to you by your health care provider. Make sure you discuss any questions you have with your health care provider. Document Released: 05/16/2009 Document Revised: 12/26/2015 Document Reviewed: 08/24/2014 Elsevier Interactive Patient Education  2017 [employer].

## 2023-02-02 NOTE — Progress Notes (Signed)
Subjective:   Garrett Woodard. is a 72 y.o. male who presents for Medicare Annual/Subsequent preventive examination.  Visit Complete: Virtual  I connected with  [name]. on 02/02/23 by a audio enabled telemedicine application and verified that I am speaking with the correct person using two identifiers.  Patient Location: Home  Provider Location: Home Office  I discussed the limitations of evaluation and management by telemedicine. The patient expressed understanding and agreed to proceed.  Patient Medicare AWV questionnaire was completed by the patient on 02/02/23; I have confirmed that all information answered by patient is correct and no changes since this date.  Review of Systems      Cardiac Risk Factors include: advanced age (>21men, >46 women);diabetes mellitus;male gender;hypertension     Objective:    Today's Vitals   02/02/23 1033  Weight: 212 lb (96.2 kg)  Height: 6\' 2"  (1.88 m)   Body mass index is 27.22 kg/m.     02/02/2023   10:45 AM 01/27/2022   10:46 AM 12/28/2020   12:35 PM 12/24/2020   12:24 PM 10/29/2020   12:53 PM 09/06/2020    5:17 PM  Advanced Directives  Does Patient Have a Medical Advance Directive? No No No No No No  Would patient like information on creating a medical advance directive? No - Patient declined No - Patient declined No - Patient declined No - Patient declined No - Patient declined     Current Medications (verified) Outpatient Encounter Medications as of 02/02/2023  Medication Sig   lisinopril (ZESTRIL) 2.5 MG tablet Take 1 tablet (2.5 mg total) by mouth daily.   metFORMIN (GLUCOPHAGE) 500 MG tablet TAKE 2 TABLETS TWICE A DAY (NEED MD APPOINTMENT)   metoprolol tartrate (LOPRESSOR) 25 MG tablet TAKE 1/2 TABLET EVERY DAY   rosuvastatin (CRESTOR) 20 MG tablet TAKE 1 TABLET EVERY DAY   tamsulosin (FLOMAX) 0.4 MG CAPS capsule Take 0.4 mg by mouth daily.   No facility-administered encounter medications on file as of  02/02/2023.    Allergies (verified) Patient has no known allergies.   History: Past Medical History:  Diagnosis Date   Allergy    Asthma    cats   Coronary artery disease 10/04/2003   Cabg atrial septal defect repair//[facility]) EF 70%, 3 vessell dz. (Dr.Callwood)10/04/2003   Diabetes mellitus    type 2   History of kidney stones    Hyperlipidemia    Hypertension    Renal disorder    Sleep apnea    Past Surgical History:  Procedure Laterality Date   CORONARY ANGIOPLASTY  10/04/2003   cath.  at [facility]): EF70%, 3 vessell dz. Dr. Felipa Eth wnl 11/16/2003// Exercise Cardiolite wnl EF 60% 12/03/2003   CORONARY ARTERY BYPASS GRAFT  10/2003   CABG atrial septal defect repair   CYSTOSCOPY WITH FULGERATION N/A 12/24/2020   Procedure: CYSTOSCOPY WITH FULGERATION;  Surgeon: Orson Ape, MD;  Location: [facility] ORS;  Service: Urology;  Laterality: N/A;   CYSTOSCOPY WITH FULGERATION N/A 12/27/2020   Procedure: CYSTOSCOPY WITH FULGERATION;  Surgeon: Orson Ape, MD;  Location: [facility] ORS;  Service: Urology;  Laterality: N/A;   TRANSURETHRAL RESECTION OF BLADDER TUMOR N/A 11/07/2020   Procedure: TRANSURETHRAL RESECTION OF BLADDER TUMOR (TURBT) WITH MITOMYCIN;  Surgeon: Orson Ape, MD;  Location: [facility] ORS;  Service: Urology;  Laterality: N/A;   Family History  Problem Relation Age of Onset   Stroke Mother    Hypertension Mother    Depression Mother  Heart disease Father 30       CABG x4   Cancer Father        lymphoma at age 35   Alcohol abuse Sister        knee replacement bilaterial disabled   Prostate cancer Neg Hx    Colon cancer Neg Hx    Social History   Socioeconomic History   Marital status: Married    Spouse name: Not on file   Number of children: Not on file   Years of education: Not on file   Highest education level: Not on file  Occupational History   Not on file  Tobacco Use   Smoking status: Former    Types: Cigarettes    Quit date: 10/04/2002     Years since quitting: 20.3   Smokeless tobacco: Never  Vaping Use   Vaping Use: Never used  Substance and Sexual Activity   Alcohol use: Yes    Alcohol/week: 0.0 standard drinks of alcohol    Comment: 5 to 6 drinks in a week   Drug use: No   Sexual activity: Not on file  Other Topics Concern   Not on file  Social History Narrative   [employer]   Likes golf, playing guitar, fishing   Retired.  Prev VP at [employer]   2 grandkids   Social Determinants of Health   Financial Resource Strain: Low Risk  (02/02/2023)   Overall Financial Resource Strain (CARDIA)    Difficulty of Paying Living Expenses: Not hard at all  Food Insecurity: No Food Insecurity (02/02/2023)   Hunger Vital Sign    Worried About Running Out of Food in the Last Year: Never true    Ran Out of Food in the Last Year: Never true  Transportation Needs: No Transportation Needs (02/02/2023)   PRAPARE - Administrator, Civil Service (Medical): No    Lack of Transportation (Non-Medical): No  Physical Activity: Sufficiently Active (02/02/2023)   Exercise Vital Sign    Days of Exercise per Week: 3 days    Minutes of Exercise per Session: 150+ min  Stress: No Stress Concern Present (02/02/2023)   [employer] of Occupational Health - Occupational Stress Questionnaire    Feeling of Stress : Not at all  Social Connections: Socially Integrated (02/02/2023)   Social Connection and Isolation Panel [NHANES]    Frequency of Communication with Friends and Family: More than three times a week    Frequency of Social Gatherings with Friends and Family: More than three times a week    Attends Religious Services: More than 4 times per year    Active Member of [employer] or Organizations: Yes    Attends Engineer, structural: More than 4 times per year    Marital Status: Married    Tobacco Counseling Counseling given: Not Answered   Clinical Intake:  Pre-visit preparation completed: Yes  Pain : No/denies  pain     BMI - recorded: 27.22 Nutritional Status: BMI 25 -29 Overweight Nutritional Risks: None Diabetes: Yes CBG done?: No Did pt. bring in CBG monitor from home?: No  How often do you need to have someone help you when you read instructions, pamphlets, or other written materials from your doctor or pharmacy?: 1 - Never  Interpreter Needed?: No  Information entered by :: Theresa Mulligan LPN   Activities of Daily Living    02/02/2023   10:44 AM 02/02/2023    8:36 AM  In your present state  of health, do you have any difficulty performing the following activities:  Hearing? 0 0  Vision? 0 0  Difficulty concentrating or making decisions? 0 0  Walking or climbing stairs? 0 0  Dressing or bathing? 0 0  Doing errands, shopping? 0 0  Preparing Food and eating ? N N  Using the Toilet? N N  In the past six months, have you accidently leaked urine? N N  Do you have problems with loss of bowel control? N N  Managing your Medications? N N  Managing your Finances? N N  Housekeeping or managing your Housekeeping? N N    Patient Care Team: Joaquim Nam, MD as PCP - General Mariah Milling Tollie Pizza, MD as Consulting Physician (Cardiology)  Indicate any recent Medical Services you may have received from other than [facility] providers in the past year (date may be approximate).     Assessment:   This is a routine wellness examination for Garrett Woodard.  Hearing/Vision screen Hearing Screening - Comments:: Denies hearing difficulties   Vision Screening - Comments::  - up to date with routine eye exams with  Dr Larence Penning  Dietary issues and exercise activities discussed:     Goals Addressed               This Visit's Progress     Increase physical activity (pt-stated)        Stay Active!       Depression Screen    02/02/2023   10:43 AM 01/27/2022   10:41 AM 06/25/2020    9:04 AM 03/12/2020   10:30 AM 05/29/2019    4:05 PM 05/24/2018    8:36 AM 05/10/2017   10:55 AM  PHQ 2/9 Scores   PHQ - 2 Score 0 0 0 0 0 0 0    Fall Risk    02/02/2023   10:44 AM 02/02/2023    8:36 AM 01/27/2022   10:44 AM 06/25/2020    9:04 AM 03/12/2020   10:30 AM  Fall Risk   Falls in the past year? 0 0 0 0 0  Number falls in past yr: 0 0 0 0   Injury with Fall? 0 0 0 0   Risk for fall due to : No Fall Risks  No Fall Risks No Fall Risks   Follow up Falls prevention discussed        MEDICARE RISK AT HOME:  Medicare Risk at Home - 02/02/23 1051     Any stairs in or around the home? No    If so, are there any without handrails? No    Home free of loose throw rugs in walkways, pet beds, electrical cords, etc? Yes    Adequate lighting in your home to reduce risk of falls? Yes    Life alert? No    Use of a cane, walker or w/c? No    Grab bars in the bathroom? No    Shower chair or bench in shower? No    Elevated toilet seat or a handicapped toilet? No             TIMED UP AND GO:  Was the test performed?  No    Cognitive Function:        02/02/2023   10:45 AM 01/27/2022   10:46 AM  6CIT Screen  What Year? 0 points 0 points  What month? 0 points 0 points  What time? 0 points 0 points  Count back from 20 0 points 0 points  Months in reverse 0 points 0 points  Repeat phrase 0 points 0 points  Total Score 0 points 0 points    Immunizations Immunization History  Administered Date(s) Administered   Fluad Quad(high Dose 65+) 06/25/2020   Influenza Split 06/03/2012   Influenza, Seasonal, Injecte, Preservative Fre 05/28/2015   Influenza-Unspecified 06/02/2014, 06/03/2018   PFIZER(Purple Top)SARS-COV-2 Vaccination 09/04/2019, 09/25/2019   Pneumococcal Polysaccharide-23 06/13/2013, 05/29/2019   Td 01/26/2005   Tdap 03/26/2015   Zoster, Live 02/13/2014    TDAP status: Up to date  Flu Vaccine status: Declined, Education has been provided regarding the importance of this vaccine but patient still declined. Advised may receive this vaccine at local pharmacy or Health Dept. Aware  to provide a copy of the vaccination record if obtained from local pharmacy or Health Dept. Verbalized acceptance and understanding.  Pneumococcal vaccine status: Due, Education has been provided regarding the importance of this vaccine. Advised may receive this vaccine at local pharmacy or Health Dept. Aware to provide a copy of the vaccination record if obtained from local pharmacy or Health Dept. Verbalized acceptance and understanding.  Covid-19 vaccine status: Completed vaccines  Qualifies for Shingles Vaccine? Yes   Zostavax completed No   Shingrix Completed?: No.    Education has been provided regarding the importance of this vaccine. Patient has been advised to call insurance company to determine out of pocket expense if they have not yet received this vaccine. Advised may also receive vaccine at local pharmacy or Health Dept. Verbalized acceptance and understanding.  Screening Tests Health Maintenance  Topic Date Due   Zoster Vaccines- Shingrix (1 of 2) Never done   COVID-19 Vaccine (3 - Pfizer risk series) 10/23/2019   Pneumonia Vaccine 67+ Years old (2 of 2 - PCV) 05/28/2020   HEMOGLOBIN A1C  08/05/2022   Diabetic kidney evaluation - eGFR measurement  02/03/2023   Diabetic kidney evaluation - Urine ACR  02/03/2023   FOOT EXAM  02/06/2023   INFLUENZA VACCINE  03/04/2023   OPHTHALMOLOGY EXAM  09/24/2023   Medicare Annual Wellness (AWV)  02/02/2024   Fecal DNA (Cologuard)  10/10/2024   DTaP/Tdap/Td (3 - Td or Tdap) 03/25/2025   Hepatitis C Screening  Completed   HPV VACCINES  Aged Out    Health Maintenance  Health Maintenance Due  Topic Date Due   Zoster Vaccines- Shingrix (1 of 2) Never done   COVID-19 Vaccine (3 - Pfizer risk series) 10/23/2019   Pneumonia Vaccine 20+ Years old (2 of 2 - PCV) 05/28/2020   HEMOGLOBIN A1C  08/05/2022   Diabetic kidney evaluation - eGFR measurement  02/03/2023   Diabetic kidney evaluation - Urine ACR  02/03/2023    Colorectal cancer  screening: Type of screening: Cologuard. Completed 10/10/21. Repeat every 3 years  Lung Cancer Screening: (Low Dose CT Chest recommended if Age 34-80 years, 20 pack-year currently smoking OR have quit w/in 15years.) does not qualify.     Additional Screening:  Hepatitis C Screening: does qualify; Completed 07/16/15  Vision Screening: Recommended annual ophthalmology exams for early detection of glaucoma and other disorders of the eye. Is the patient up to date with their annual eye exam?  Yes  Who is the provider or what is the name of the office in which the patient attends annual eye exams? Dr Larence Penning If pt is not established with a provider, would they like to be referred to a provider to establish care? No .   Dental Screening: Recommended annual dental exams for proper oral  hygiene  Diabetic Foot Exam: Diabetic Foot Exam: Completed 02/05/22  Community Resource Referral / Chronic Care Management:  CRR required this visit?  No   CCM required this visit?  No     Plan:     I have personally reviewed and noted the following in the patient's chart:   Medical and social history Use of alcohol, tobacco or illicit drugs  Current medications and supplements including opioid prescriptions. Patient is not currently taking opioid prescriptions. Functional ability and status Nutritional status Physical activity Advanced directives List of other physicians Hospitalizations, surgeries, and ER visits in previous 12 months Vitals Screenings to include cognitive, depression, and falls Referrals and appointments  In addition, I have reviewed and discussed with patient certain preventive protocols, quality metrics, and best practice recommendations. A written personalized care plan for preventive services as well as general preventive health recommendations were provided to patient.     [name], LPN   08/08/1094   After Visit Summary: (MyChart) Due to this being a telephonic  visit, the after visit summary with patients personalized plan was offered to patient via MyChart   Nurse Notes: Patient due Hemoglobin A1C

## 2023-02-02 NOTE — Telephone Encounter (Signed)
Patient had AWV with wellness nurse today. Patient needs to schedule labs and AWV 2 with Dr. Para March.

## 2023-02-03 NOTE — Telephone Encounter (Signed)
Patient scheduled.

## 2023-02-08 ENCOUNTER — Other Ambulatory Visit: Payer: Self-pay | Admitting: Family Medicine

## 2023-02-08 DIAGNOSIS — E119 Type 2 diabetes mellitus without complications: Secondary | ICD-10-CM

## 2023-02-11 ENCOUNTER — Other Ambulatory Visit (INDEPENDENT_AMBULATORY_CARE_PROVIDER_SITE_OTHER): Payer: Medicare HMO

## 2023-02-11 DIAGNOSIS — E119 Type 2 diabetes mellitus without complications: Secondary | ICD-10-CM

## 2023-02-11 LAB — HEMOGLOBIN A1C: Hgb A1c MFr Bld: 8 % — ABNORMAL HIGH (ref 4.6–6.5)

## 2023-02-11 LAB — LIPID PANEL
Cholesterol: 132 mg/dL (ref 0–200)
HDL: 60.4 mg/dL (ref >39.00)
LDL Cholesterol: 53 mg/dL (ref 0–99)
NonHDL: 71.31
Total CHOL/HDL Ratio: 2
Triglycerides: 94 mg/dL (ref 0.0–149.0)
VLDL: 18.8 mg/dL (ref 0.0–40.0)

## 2023-02-11 LAB — COMPREHENSIVE METABOLIC PANEL
ALT: 28 U/L (ref 0–53)
AST: 18 U/L (ref 0–37)
Albumin: 4.8 g/dL (ref 3.5–5.2)
Alkaline Phosphatase: 62 U/L (ref 39–117)
BUN: 15 mg/dL (ref 6–23)
CO2: 27 mEq/L (ref 19–32)
Calcium: 10.2 mg/dL (ref 8.4–10.5)
Chloride: 101 mEq/L (ref 96–112)
Creatinine, Ser: 1.21 mg/dL (ref 0.40–1.50)
GFR: 60.94 mL/min (ref >60.00)
Glucose, Bld: 201 mg/dL — ABNORMAL HIGH (ref 70–99)
Potassium: 4.5 mEq/L (ref 3.5–5.1)
Sodium: 137 mEq/L (ref 135–145)
Total Bilirubin: 1 mg/dL (ref 0.2–1.2)
Total Protein: 7.5 g/dL (ref 6.0–8.3)

## 2023-02-11 LAB — CBC WITH DIFFERENTIAL/PLATELET
Basophils Absolute: 0 10*3/uL (ref 0.0–0.1)
Basophils Relative: 0.6 % (ref 0.0–3.0)
Eosinophils Absolute: 0.3 10*3/uL (ref 0.0–0.7)
Eosinophils Relative: 4.9 % (ref 0.0–5.0)
HCT: 43.6 % (ref 39.0–52.0)
Hemoglobin: 14.6 g/dL (ref 13.0–17.0)
Lymphocytes Relative: 29.3 % (ref 12.0–46.0)
Lymphs Abs: 1.9 10*3/uL (ref 0.7–4.0)
MCHC: 33.6 g/dL (ref 30.0–36.0)
MCV: 91.8 fl (ref 78.0–100.0)
Monocytes Absolute: 0.6 10*3/uL (ref 0.1–1.0)
Monocytes Relative: 9.4 % (ref 3.0–12.0)
Neutro Abs: 3.6 10*3/uL (ref 1.4–7.7)
Neutrophils Relative %: 55.8 % (ref 43.0–77.0)
Platelets: 286 10*3/uL (ref 150.0–400.0)
RBC: 4.75 Mil/uL (ref 4.22–5.81)
RDW: 13.5 % (ref 11.5–15.5)
WBC: 6.4 10*3/uL (ref 4.0–10.5)

## 2023-02-11 NOTE — Addendum Note (Signed)
Addended by: Shon Millet on: 02/11/2023 10:57 AM   Modules accepted: Orders

## 2023-02-12 LAB — MICROALBUMIN / CREATININE URINE RATIO
Creatinine,U: 422.8 mg/dL
Microalb Creat Ratio: 0.7 mg/g (ref 0.0–30.0)
Microalb, Ur: 3.1 mg/dL — ABNORMAL HIGH (ref 0.0–1.9)

## 2023-02-18 ENCOUNTER — Encounter: Payer: Self-pay | Admitting: Family Medicine

## 2023-02-18 ENCOUNTER — Ambulatory Visit (INDEPENDENT_AMBULATORY_CARE_PROVIDER_SITE_OTHER): Payer: Medicare HMO | Admitting: Family Medicine

## 2023-02-18 VITALS — BP 122/76 | HR 87 | Temp 98.3°F | Ht 74.0 in | Wt 217.0 lb

## 2023-02-18 DIAGNOSIS — R809 Proteinuria, unspecified: Secondary | ICD-10-CM

## 2023-02-18 DIAGNOSIS — E1129 Type 2 diabetes mellitus with other diabetic kidney complication: Secondary | ICD-10-CM | POA: Diagnosis not present

## 2023-02-18 DIAGNOSIS — E7800 Pure hypercholesterolemia, unspecified: Secondary | ICD-10-CM

## 2023-02-18 DIAGNOSIS — Z0000 Encounter for general adult medical examination without abnormal findings: Secondary | ICD-10-CM

## 2023-02-18 DIAGNOSIS — E119 Type 2 diabetes mellitus without complications: Secondary | ICD-10-CM

## 2023-02-18 DIAGNOSIS — Z7189 Other specified counseling: Secondary | ICD-10-CM

## 2023-02-18 MED ORDER — METFORMIN HCL 500 MG PO TABS
500.0000 mg | ORAL_TABLET | Freq: Every day | ORAL | Status: DC
Start: 2023-02-18 — End: 2023-03-19

## 2023-02-18 MED ORDER — OZEMPIC (0.25 OR 0.5 MG/DOSE) 2 MG/3ML ~~LOC~~ SOPN: PEN_INJECTOR | SUBCUTANEOUS | 1 refills | Status: DC

## 2023-02-18 MED ORDER — METFORMIN HCL 500 MG PO TABS: 500.0000 mg | ORAL_TABLET | Freq: Every day | ORAL | Status: DC

## 2023-02-18 NOTE — Patient Instructions (Addendum)
I would start ozempic and cut metfromin back to 1-2 tabs a day.  Recheck A1c at a visit in about 3 months at a visit.  Take care.  Glad to see you. I would get a flu shot each fall.

## 2023-02-18 NOTE — Progress Notes (Unsigned)
Diabetes:  Using medications without difficulties: d/w pt about metformin vs ozempic.  Having diarrhea with metformin.   Hypoglycemic episodes: no Hyperglycemic episodes: no Feet problems: no Blood Sugars averaging: not checked.  eye exam within last year: yes  Flu encouraged. Shingles discussed with patient. PNA-23 done 2020 Tetanus 2016 Covid vaccine previously done per patient report. Colon cancer screening up-to-date with Cologuard 2023 Prostate cancer screening declined.  Discussed with patient about pros and cons of prostate cancer screening. Advance directive discussed with patient.  Wife designated if patient were incapacitated. AAA screening.  D/w pt, he'll consider.    Elevated Cholesterol: Using medications without problems: yes Muscle aches: no Diet compliance: yes.  Exercise: yes. Labs d/w pt.    PMH and SH reviewed  Meds, vitals, and allergies reviewed.   ROS: Per HPI unless specifically indicated in ROS section   GEN: nad, alert and oriented HEENT: mucous membranes moist NECK: supple w/o LA CV: rrr. PULM: ctab, no inc wob ABD: soft, +bs EXT: no edema SKIN: no acute rash  Diabetic foot exam: Normal inspection No skin breakdown No calluses  Normal DP pulses Normal sensation to light touch and monofilament Nails normal

## 2023-02-21 DIAGNOSIS — Z0000 Encounter for general adult medical examination without abnormal findings: Secondary | ICD-10-CM | POA: Insufficient documentation

## 2023-02-21 NOTE — Assessment & Plan Note (Signed)
Continue Crestor.  Continue work on diet and exercise. ?

## 2023-02-21 NOTE — Assessment & Plan Note (Signed)
Advance directive discussed with patient.  Wife designated if patient were incapacitated. 

## 2023-02-21 NOTE — Assessment & Plan Note (Signed)
Also precautions discussed with patient.  Start ozempic and cut metfromin back to 1-2 tabs a day.  Recheck A1c at a visit in about 3 months at a visit.  Update me as needed in the meantime.  Continue work on diet and exercise.

## 2023-02-21 NOTE — Assessment & Plan Note (Signed)
Flu encouraged. Shingles discussed with patient. PNA-23 done 2020 Tetanus 2016 Covid vaccine previously done per patient report. Colon cancer screening up-to-date with Cologuard 2023 Prostate cancer screening declined.  Discussed with patient about pros and cons of prostate cancer screening. Advance directive discussed with patient.  Wife designated if patient were incapacitated. AAA screening.  D/w pt, he'll consider.

## 2023-03-13 ENCOUNTER — Other Ambulatory Visit: Payer: Self-pay | Admitting: Family Medicine

## 2023-03-13 DIAGNOSIS — I10 Essential (primary) hypertension: Secondary | ICD-10-CM

## 2023-03-19 ENCOUNTER — Other Ambulatory Visit: Payer: Self-pay | Admitting: Family Medicine

## 2023-03-19 DIAGNOSIS — E7800 Pure hypercholesterolemia, unspecified: Secondary | ICD-10-CM

## 2023-03-19 DIAGNOSIS — E119 Type 2 diabetes mellitus without complications: Secondary | ICD-10-CM

## 2023-05-02 ENCOUNTER — Other Ambulatory Visit: Payer: Self-pay | Admitting: Family Medicine

## 2023-05-24 ENCOUNTER — Encounter: Payer: Self-pay | Admitting: Family Medicine

## 2023-05-24 ENCOUNTER — Ambulatory Visit: Payer: Medicare HMO | Admitting: Family Medicine

## 2023-05-24 VITALS — BP 118/68 | HR 85 | Temp 98.5°F | Ht 74.0 in | Wt 212.8 lb

## 2023-05-24 DIAGNOSIS — E119 Type 2 diabetes mellitus without complications: Secondary | ICD-10-CM

## 2023-05-24 DIAGNOSIS — Z7984 Long term (current) use of oral hypoglycemic drugs: Secondary | ICD-10-CM

## 2023-05-24 DIAGNOSIS — R809 Proteinuria, unspecified: Secondary | ICD-10-CM

## 2023-05-24 DIAGNOSIS — Z7985 Long-term (current) use of injectable non-insulin antidiabetic drugs: Secondary | ICD-10-CM | POA: Diagnosis not present

## 2023-05-24 DIAGNOSIS — E1129 Type 2 diabetes mellitus with other diabetic kidney complication: Secondary | ICD-10-CM

## 2023-05-24 LAB — POCT GLYCOSYLATED HEMOGLOBIN (HGB A1C): Hemoglobin A1C: 6.9 % — AB (ref 4.0–5.6)

## 2023-05-24 MED ORDER — METFORMIN HCL 500 MG PO TABS
500.0000 mg | ORAL_TABLET | Freq: Every day | ORAL | Status: DC
Start: 2023-05-24 — End: 2024-01-31

## 2023-05-24 MED ORDER — OZEMPIC (0.25 OR 0.5 MG/DOSE) 2 MG/3ML ~~LOC~~ SOPN: PEN_INJECTOR | SUBCUTANEOUS | 3 refills | Status: DC

## 2023-05-24 NOTE — Progress Notes (Unsigned)
Diabetes:  Using medications without difficulties: yes Hypoglycemic episodes: no Hyperglycemic episodes:no Feet problems: no Blood Sugars averaging:  not checked.   eye exam within last year: yes A1c 6.9.   Ozempic 0.5mg  weekly.   Metformin daily.  Lower appetite on ozempic.  No abd pain.  No GI sx except for one episode of fecal urgency.  Cautions d/w pt.   Meds, vitals, and allergies reviewed.  ROS: Per HPI unless specifically indicated in ROS section   GEN: nad, alert and oriented HEENT: ncat NECK: supple w/o LA CV: rrr. PULM: ctab, no inc wob ABD: soft, +bs EXT: no edema SKIN: well perfused.

## 2023-05-24 NOTE — Patient Instructions (Addendum)
Recheck A1c at a visit in about 4-5 months.   If you have trouble with low sugars in the meantime then let me know.  Take care.  Glad to see you.

## 2023-05-26 NOTE — Assessment & Plan Note (Signed)
A1c 6.9.  No change in meds. Ozempic 0.5mg  weekly.   Metformin daily.  Lower appetite on ozempic.  No abd pain.  No GI sx except for one episode of fecal urgency.  Cautions d/w pt.  Recheck A1c at a visit in about 4-5 months.   Update me as needed in the meantime.

## 2023-09-29 DIAGNOSIS — Z7984 Long term (current) use of oral hypoglycemic drugs: Secondary | ICD-10-CM | POA: Diagnosis not present

## 2023-09-29 DIAGNOSIS — H524 Presbyopia: Secondary | ICD-10-CM | POA: Diagnosis not present

## 2023-09-29 DIAGNOSIS — H5213 Myopia, bilateral: Secondary | ICD-10-CM | POA: Diagnosis not present

## 2023-09-29 DIAGNOSIS — H52223 Regular astigmatism, bilateral: Secondary | ICD-10-CM | POA: Diagnosis not present

## 2023-09-29 DIAGNOSIS — E119 Type 2 diabetes mellitus without complications: Secondary | ICD-10-CM | POA: Diagnosis not present

## 2023-09-29 DIAGNOSIS — Z961 Presence of intraocular lens: Secondary | ICD-10-CM | POA: Diagnosis not present

## 2023-09-29 LAB — HM DIABETES EYE EXAM

## 2023-10-22 ENCOUNTER — Ambulatory Visit: Payer: Medicare HMO | Admitting: Family Medicine

## 2023-10-22 VITALS — BP 118/64 | HR 83 | Temp 98.4°F | Ht 74.0 in | Wt 217.0 lb

## 2023-10-22 DIAGNOSIS — E119 Type 2 diabetes mellitus without complications: Secondary | ICD-10-CM

## 2023-10-22 DIAGNOSIS — R809 Proteinuria, unspecified: Secondary | ICD-10-CM | POA: Diagnosis not present

## 2023-10-22 DIAGNOSIS — E1129 Type 2 diabetes mellitus with other diabetic kidney complication: Secondary | ICD-10-CM

## 2023-10-22 LAB — POCT GLYCOSYLATED HEMOGLOBIN (HGB A1C): Hemoglobin A1C: 7.5 % — AB (ref 4.0–5.6)

## 2023-10-22 NOTE — Patient Instructions (Addendum)
 Recheck at a yearly visit later in 2025 - around August.  Labs ahead of time if possible.  Take care.  Glad to see you. Don't change your meds for now.

## 2023-10-22 NOTE — Progress Notes (Signed)
 Diabetes:  Using medications without difficulties: yes Hypoglycemic episodes:no sx Hyperglycemic episodes:no sx Feet problems: no Blood Sugars averaging: not checked.  eye exam within last year: yes A1c 7.5.  he had been off ozempic at the end of 2024.  That likely affected his sugars.   Playing tennis mult times per week.   Prev MALB d/w pt.  Ratio <<30.    He is off flomax and not having LUTS now.   Meds, vitals, and allergies reviewed.  ROS: Per HPI unless specifically indicated in ROS section   GEN: nad, alert and oriented HEENT: ncat NECK: supple w/o LA CV: rrr. PULM: ctab, no inc wob ABD: soft, +bs EXT: no edema SKIN: well perfused.

## 2023-10-24 NOTE — Assessment & Plan Note (Signed)
 A1c 7.5.  he had been off ozempic at the end of 2024.  That likely affected his sugars.   Playing tennis mult times per week.   Prev MALB d/w pt.  Ratio <<30.   Recheck at a yearly visit later in 2025 - around August.  Labs ahead of time if possible.  No change in meds .

## 2023-11-17 ENCOUNTER — Other Ambulatory Visit: Payer: Self-pay | Admitting: Family Medicine

## 2024-01-05 ENCOUNTER — Other Ambulatory Visit: Payer: Self-pay | Admitting: Family Medicine

## 2024-01-05 DIAGNOSIS — I10 Essential (primary) hypertension: Secondary | ICD-10-CM

## 2024-01-29 ENCOUNTER — Other Ambulatory Visit: Payer: Self-pay | Admitting: Family Medicine

## 2024-01-29 DIAGNOSIS — E119 Type 2 diabetes mellitus without complications: Secondary | ICD-10-CM

## 2024-01-29 DIAGNOSIS — E7800 Pure hypercholesterolemia, unspecified: Secondary | ICD-10-CM

## 2024-02-20 ENCOUNTER — Other Ambulatory Visit: Payer: Self-pay | Admitting: Family Medicine

## 2024-02-20 DIAGNOSIS — E119 Type 2 diabetes mellitus without complications: Secondary | ICD-10-CM

## 2024-02-23 ENCOUNTER — Other Ambulatory Visit (INDEPENDENT_AMBULATORY_CARE_PROVIDER_SITE_OTHER)

## 2024-02-23 DIAGNOSIS — E119 Type 2 diabetes mellitus without complications: Secondary | ICD-10-CM | POA: Diagnosis not present

## 2024-02-23 LAB — LIPID PANEL
Cholesterol: 132 mg/dL (ref 0–200)
HDL: 59.5 mg/dL (ref >39.00)
LDL Cholesterol: 53 mg/dL (ref 0–99)
NonHDL: 72.68
Total CHOL/HDL Ratio: 2
Triglycerides: 98 mg/dL (ref 0.0–149.0)
VLDL: 19.6 mg/dL (ref 0.0–40.0)

## 2024-02-23 LAB — COMPREHENSIVE METABOLIC PANEL WITH GFR
ALT: 23 U/L (ref 0–53)
AST: 18 U/L (ref 0–37)
Albumin: 4.2 g/dL (ref 3.5–5.2)
Alkaline Phosphatase: 56 U/L (ref 39–117)
BUN: 10 mg/dL (ref 6–23)
CO2: 26 meq/L (ref 19–32)
Calcium: 8.9 mg/dL (ref 8.4–10.5)
Chloride: 105 meq/L (ref 96–112)
Creatinine, Ser: 0.97 mg/dL (ref 0.40–1.50)
GFR: 78.88 mL/min (ref >60.00)
Glucose, Bld: 167 mg/dL — ABNORMAL HIGH (ref 70–99)
Potassium: 4.4 meq/L (ref 3.5–5.1)
Sodium: 139 meq/L (ref 135–145)
Total Bilirubin: 0.5 mg/dL (ref 0.2–1.2)
Total Protein: 7 g/dL (ref 6.0–8.3)

## 2024-02-23 LAB — HEMOGLOBIN A1C: Hgb A1c MFr Bld: 7.6 % — ABNORMAL HIGH (ref 4.6–6.5)

## 2024-02-23 LAB — CBC WITH DIFFERENTIAL/PLATELET
Basophils Absolute: 0 K/uL (ref 0.0–0.1)
Basophils Relative: 0.5 % (ref 0.0–3.0)
Eosinophils Absolute: 0.5 K/uL (ref 0.0–0.7)
Eosinophils Relative: 7.9 % — ABNORMAL HIGH (ref 0.0–5.0)
HCT: 42 % (ref 39.0–52.0)
Hemoglobin: 14.2 g/dL (ref 13.0–17.0)
Lymphocytes Relative: 31.1 % (ref 12.0–46.0)
Lymphs Abs: 1.9 K/uL (ref 0.7–4.0)
MCHC: 33.9 g/dL (ref 30.0–36.0)
MCV: 90.6 fl (ref 78.0–100.0)
Monocytes Absolute: 0.4 K/uL (ref 0.1–1.0)
Monocytes Relative: 6.1 % (ref 3.0–12.0)
Neutro Abs: 3.3 K/uL (ref 1.4–7.7)
Neutrophils Relative %: 54.4 % (ref 43.0–77.0)
Platelets: 260 K/uL (ref 150.0–400.0)
RBC: 4.63 Mil/uL (ref 4.22–5.81)
RDW: 13.2 % (ref 11.5–15.5)
WBC: 6.1 K/uL (ref 4.0–10.5)

## 2024-02-23 LAB — MICROALBUMIN / CREATININE URINE RATIO
Creatinine,U: 184.8 mg/dL
Microalb Creat Ratio: 3.9 mg/g (ref 0.0–30.0)
Microalb, Ur: 0.7 mg/dL (ref 0.0–1.9)

## 2024-02-27 ENCOUNTER — Ambulatory Visit: Payer: Self-pay | Admitting: Family Medicine

## 2024-02-29 ENCOUNTER — Encounter: Payer: Self-pay | Admitting: Cardiovascular Disease

## 2024-03-03 ENCOUNTER — Encounter: Payer: Self-pay | Admitting: Family Medicine

## 2024-03-03 ENCOUNTER — Ambulatory Visit (INDEPENDENT_AMBULATORY_CARE_PROVIDER_SITE_OTHER): Admitting: Family Medicine

## 2024-03-03 VITALS — BP 138/76 | HR 79 | Temp 98.2°F | Ht 72.0 in | Wt 215.2 lb

## 2024-03-03 DIAGNOSIS — E7800 Pure hypercholesterolemia, unspecified: Secondary | ICD-10-CM | POA: Diagnosis not present

## 2024-03-03 DIAGNOSIS — Z0000 Encounter for general adult medical examination without abnormal findings: Secondary | ICD-10-CM

## 2024-03-03 DIAGNOSIS — Z7189 Other specified counseling: Secondary | ICD-10-CM

## 2024-03-03 DIAGNOSIS — Z136 Encounter for screening for cardiovascular disorders: Secondary | ICD-10-CM

## 2024-03-03 DIAGNOSIS — R809 Proteinuria, unspecified: Secondary | ICD-10-CM

## 2024-03-03 DIAGNOSIS — Z7984 Long term (current) use of oral hypoglycemic drugs: Secondary | ICD-10-CM

## 2024-03-03 DIAGNOSIS — E119 Type 2 diabetes mellitus without complications: Secondary | ICD-10-CM | POA: Diagnosis not present

## 2024-03-03 DIAGNOSIS — Z8603 Personal history of neoplasm of uncertain behavior: Secondary | ICD-10-CM | POA: Diagnosis not present

## 2024-03-03 DIAGNOSIS — Z9889 Other specified postprocedural states: Secondary | ICD-10-CM | POA: Diagnosis not present

## 2024-03-03 DIAGNOSIS — E1129 Type 2 diabetes mellitus with other diabetic kidney complication: Secondary | ICD-10-CM

## 2024-03-03 DIAGNOSIS — I10 Essential (primary) hypertension: Secondary | ICD-10-CM | POA: Diagnosis not present

## 2024-03-03 MED ORDER — METFORMIN HCL 500 MG PO TABS: 500.0000 mg | ORAL_TABLET | Freq: Every day | ORAL | Status: AC

## 2024-03-03 NOTE — Patient Instructions (Addendum)
 Recheck in about 6 months.  A1c at the visit.  Take care.  Glad to see you.  When you are getting close to finishing your current supply of ozempic , let me know if you want to try the 1mg  dose.  I can work on the rx at that point.   You should get a call about AAA screening.

## 2024-03-03 NOTE — Progress Notes (Signed)
 Diabetes:  Using medications without difficulties: yes Hypoglycemic episodes:no Hyperglycemic episodes:no Feet problems:no Blood Sugars averaging: not checked.   eye exam within last year:yes He felt better on lower dose of metformin .  Less GI sx on lower dose.   Hypertension:    Using medication without problems or lightheadedness: yes Chest pain with exertion:no Edema:no Short of breath:no  Elevated Cholesterol: Using medications without problems:yes Muscle aches: no Diet compliance: yes Exercise: yes  H/o bladder tumor resection, d/w pt about urology f/u.  No blood in urine.  He opted out of urology f/u now.    Flu encouraged. Shingles discussed with patient. PNA-23 done 2020 Tetanus 2016 Covid vaccine d/w pt Colon cancer screening up-to-date with Cologuard 2023 Prostate cancer screening declined.  Discussed with patient about pros and cons of prostate cancer screening. Advance directive discussed with patient.  Wife designated if patient were incapacitated. AAA screening ordered 2025.   PMH and SH reviewed.   Vital signs, Meds and allergies reviewed.  ROS: Per HPI unless specifically indicated in ROS section   GEN: nad, alert and oriented HEENT: ncat NECK: supple w/o LA CV: rrr PULM: ctab, no inc wob ABD: soft, +bs EXT: no edema SKIN: no acute rash  Diabetic foot exam: Normal inspection No skin breakdown No calluses  Normal DP pulses Normal sensation to light tough and monofilament Nails normal

## 2024-03-05 DIAGNOSIS — Z8603 Personal history of neoplasm of uncertain behavior: Secondary | ICD-10-CM | POA: Insufficient documentation

## 2024-03-05 NOTE — Assessment & Plan Note (Signed)
 Recheck in about 6 months.  A1c at the visit.    When close to finishing current supply of ozempic , he can let me know if he wants to try the 1mg  dose.  I can work on the rx at that point.   Continue work on diet and exercise.

## 2024-03-05 NOTE — Assessment & Plan Note (Signed)
Advance directive discussed with patient.  Wife designated if patient were incapacitated. 

## 2024-03-05 NOTE — Assessment & Plan Note (Signed)
 Flu encouraged. Shingles discussed with patient. PNA-23 done 2020 Tetanus 2016 Covid vaccine d/w pt Colon cancer screening up-to-date with Cologuard 2023 Prostate cancer screening declined.  Discussed with patient about pros and cons of prostate cancer screening. Advance directive discussed with patient.  Wife designated if patient were incapacitated. AAA screening ordered 2025.

## 2024-03-05 NOTE — Assessment & Plan Note (Signed)
 Continue Crestor .  Discussed that statins can cause mild sugar elevations but that would not be enough to explain him having diabetes and there is also a clinical benefit for statin use with cardiac disease.  Would continue as is.

## 2024-03-05 NOTE — Assessment & Plan Note (Signed)
 Continue lisinopril and metoprolol ?

## 2024-03-05 NOTE — Assessment & Plan Note (Signed)
 H/o bladder tumor resection, d/w pt about urology f/u.  No blood in urine.  He opted out of urology f/u now.

## 2024-03-07 ENCOUNTER — Ambulatory Visit
Admission: RE | Admit: 2024-03-07 | Discharge: 2024-03-07 | Disposition: A | Discharge status: Home / Self Care | Source: Ambulatory Visit | Attending: Family Medicine | Admitting: Family Medicine

## 2024-03-07 DIAGNOSIS — Z136 Encounter for screening for cardiovascular disorders: Secondary | ICD-10-CM | POA: Diagnosis not present

## 2024-03-07 DIAGNOSIS — Z87891 Personal history of nicotine dependence: Secondary | ICD-10-CM | POA: Diagnosis not present

## 2024-03-12 ENCOUNTER — Ambulatory Visit: Payer: Self-pay | Admitting: Family Medicine

## 2024-04-06 ENCOUNTER — Other Ambulatory Visit: Payer: Self-pay | Admitting: Family Medicine

## 2024-04-07 ENCOUNTER — Ambulatory Visit

## 2024-04-07 VITALS — Ht 74.0 in | Wt 215.0 lb

## 2024-04-07 DIAGNOSIS — Z0000 Encounter for general adult medical examination without abnormal findings: Secondary | ICD-10-CM

## 2024-04-07 NOTE — Progress Notes (Signed)
 Subjective:   Garrett Woodard. is a 73 y.o. who presents for a Medicare Wellness preventive visit.  As a reminder, Annual Wellness Visits don't include a physical exam, and some assessments may be limited, especially if this visit is performed virtually. We may recommend an in-person follow-up visit with your provider if needed.  Visit Complete: Virtual I connected with  Garrett Woodard. on 04/07/24 by a audio enabled telemedicine application and verified that I am speaking with the correct person using two identifiers.  Patient Location: Home  Provider Location: Office/Clinic  I discussed the limitations of evaluation and management by telemedicine. The patient expressed understanding and agreed to proceed.  Vital Signs: Because this visit was a virtual/telehealth visit, some criteria may be missing or patient reported. Any vitals not documented were not able to be obtained and vitals that have been documented are patient reported.  VideoDeclined- This patient declined Librarian, academic. Therefore the visit was completed with audio only.  Persons Participating in Visit: Patient.  AWV Questionnaire: Yes: Patient Medicare AWV questionnaire was completed by the patient on 04/03/24; I have confirmed that all information answered by patient is correct and no changes since this date.  Cardiac Risk Factors include: advanced age (>27men, >11 women);diabetes mellitus;dyslipidemia;hypertension;male gender     Objective:    Today's Vitals   04/07/24 1350  Weight: 215 lb (97.5 kg)  Height: 6' 2 (1.88 m)   Body mass index is 27.6 kg/m.     04/07/2024    1:56 PM 02/02/2023   10:45 AM 01/27/2022   10:46 AM 12/28/2020   12:35 PM 12/24/2020   12:24 PM 10/29/2020   12:53 PM 09/06/2020    5:17 PM  Advanced Directives  Does Patient Have a Medical Advance Directive? Yes No No No No No No  Type of Estate agent of [place];Living will         Copy of Healthcare Power of Attorney in Chart? No - copy requested        Would patient like information on creating a medical advance directive?  No - Patient declined No - Patient declined No - Patient declined No - Patient declined No - Patient declined     Current Medications (verified) Outpatient Encounter Medications as of 04/07/2024  Medication Sig   lisinopril  (ZESTRIL ) 2.5 MG tablet TAKE 1 TABLET EVERY DAY   metFORMIN  (GLUCOPHAGE ) 500 MG tablet Take 1 tablet (500 mg total) by mouth daily with breakfast.   metoprolol  tartrate (LOPRESSOR ) 25 MG tablet TAKE 1/2 TABLET EVERY DAY   rosuvastatin  (CRESTOR ) 20 MG tablet TAKE 1 TABLET EVERY DAY   Semaglutide ,0.25 or 0.5MG /DOS, (OZEMPIC , 0.25 OR 0.5 MG/DOSE,) 2 MG/3ML SOPN 0.5mg  injected weekly.   No facility-administered encounter medications on file as of 04/07/2024.    Allergies (verified) Patient has no known allergies.   History: Past Medical History:  Diagnosis Date   Allergy    Asthma    cats   Blood transfusion without reported diagnosis March 2022   Coronary artery disease 10/04/2003   Cabg atrial septal defect repair//Cath [facility]) EF 70%, 3 vessell dz. (Dr.Callwood)10/04/2003   Diabetes mellitus    type 2   History of kidney stones    Hyperlipidemia    Hypertension    Renal disorder    Sleep apnea    Past Surgical History:  Procedure Laterality Date   CORONARY ANGIOPLASTY  10/04/2003   cath.  at [facility]): EF70%, 3 vessell dz.  Dr. Ileen wnl 11/16/2003// Exercise Cardiolite wnl EF 60% 12/03/2003   CORONARY ARTERY BYPASS GRAFT  10/02/2003   CABG atrial septal defect repair   CYSTOSCOPY WITH FULGERATION N/A 12/24/2020   Procedure: CYSTOSCOPY WITH FULGERATION;  Surgeon: Kassie Ozell SAUNDERS, MD;  Location: [facility] ORS;  Service: Urology;  Laterality: N/A;   CYSTOSCOPY WITH FULGERATION N/A 12/27/2020   Procedure: CYSTOSCOPY WITH FULGERATION;  Surgeon: Kassie Ozell SAUNDERS, MD;  Location: [facility] ORS;  Service: Urology;   Laterality: N/A;   EYE SURGERY  Sept 2022   Cataracts   TRANSURETHRAL RESECTION OF BLADDER TUMOR N/A 11/07/2020   Procedure: TRANSURETHRAL RESECTION OF BLADDER TUMOR (TURBT) WITH MITOMYCIN ;  Surgeon: Kassie Ozell SAUNDERS, MD;  Location: [facility] ORS;  Service: Urology;  Laterality: N/A;   Family History  Problem Relation Age of Onset   Stroke Mother    Hypertension Mother    Depression Mother    Heart disease Father 38       CABG x4   Cancer Father        lymphoma at age 94   Alcohol abuse Sister        knee replacement bilaterial disabled   Arthritis Sister    Birth defects Daughter    Prostate cancer Neg Hx    Colon cancer Neg Hx    Social History   Socioeconomic History   Marital status: Married    Spouse name: Not on file   Number of children: Not on file   Years of education: Not on file   Highest education level: Associate degree: occupational, Scientist, product/process development, or vocational program  Occupational History   Not on file  Tobacco Use   Smoking status: Former    Current packs/day: 0.00    Types: Cigarettes    Quit date: 10/04/2002    Years since quitting: 21.5   Smokeless tobacco: Never  Vaping Use   Vaping status: Never Used  Substance and Sexual Activity   Alcohol use: Yes    Alcohol/week: 6.0 standard drinks of alcohol    Types: 3 Glasses of wine, 3 Cans of beer per week    Comment: 5 to 6 drinks in a week   Drug use: No   Sexual activity: Yes    Birth control/protection: None  Other Topics Concern   Not on file  Social History Narrative   [employer]   Likes golf, playing guitar, fishing   Retired.  Prev VP at [employer]   2 grandkids   Social Drivers of Health   Financial Resource Strain: Low Risk  (04/07/2024)   Overall Financial Resource Strain (CARDIA)    Difficulty of Paying Living Expenses: Not very hard  Food Insecurity: No Food Insecurity (04/07/2024)   Hunger Vital Sign    Worried About Running Out of Food in the Last Year: Never true    Ran Out of  Food in the Last Year: Never true  Transportation Needs: No Transportation Needs (04/07/2024)   PRAPARE - Administrator, Civil Service (Medical): No    Lack of Transportation (Non-Medical): No  Physical Activity: Sufficiently Active (04/07/2024)   Exercise Vital Sign    Days of Exercise per Week: 3 days    Minutes of Exercise per Session: 120 min  Stress: No Stress Concern Present (04/07/2024)   [employer] of Occupational Health - Occupational Stress Questionnaire    Feeling of Stress: Not at all  Social Connections: Socially Integrated (04/07/2024)   Social Connection and Isolation Panel  Frequency of Communication with Friends and Family: More than three times a week    Frequency of Social Gatherings with Friends and Family: Twice a week    Attends Religious Services: 1 to 4 times per year    Active Member of [employer] or Organizations: No    Attends Engineer, structural: 1 to 4 times per year    Marital Status: Married    Tobacco Counseling Counseling given: Not Answered  Clinical Intake:  Pre-visit preparation completed: Yes  Pain : No/denies pain    BMI - recorded: 27.6 Nutritional Status: BMI 25 -29 Overweight Nutritional Risks: None Diabetes: Yes CBG done?: No Did pt. bring in CBG monitor from home?: No  Lab Results  Component Value Date   HGBA1C 7.6 (H) 02/23/2024   HGBA1C 7.5 (A) 10/22/2023   HGBA1C 6.9 (A) 05/24/2023     How often do you need to have someone help you when you read instructions, pamphlets, or other written materials from your doctor or pharmacy?: 1 - Never  Interpreter Needed?: No  Comments: lives with wife Information entered by :: B.Chava Dulac,LPN   Activities of Daily Living     04/03/2024    8:58 AM  In your present state of health, do you have any difficulty performing the following activities:  Hearing? 0  Vision? 0  Difficulty concentrating or making decisions? 0  Walking or climbing stairs? 0  Dressing or  bathing? 0  Doing errands, shopping? 0  Preparing Food and eating ? N  Using the Toilet? N  In the past six months, have you accidently leaked urine? N  Do you have problems with loss of bowel control? N  Managing your Medications? N  Managing your Finances? N  Housekeeping or managing your Housekeeping? N    Patient Care Team: Cleatus Arlyss RAMAN, MD as PCP - General Gollan, Evalene PARAS, MD as Consulting Physician (Cardiology) Laurice Francis NOVAK, OD (Optometry)  I have updated your Care Teams any recent Medical Services you may have received from other providers in the past year.     Assessment:   This is a routine wellness examination for Quasim.  Hearing/Vision screen Hearing Screening - Comments:: Patient denies any hearing difficulties.   Vision Screening - Comments:: Pt says their vision is good without glasses Dr  Laurice   Goals Addressed               This Visit's Progress     Increase physical activity (pt-stated)   On track     04/07/24-Stay Active!       Depression Screen     04/07/2024    1:55 PM 03/03/2024    2:07 PM 10/22/2023    2:04 PM 02/18/2023    2:34 PM 02/02/2023   10:43 AM 01/27/2022   10:41 AM 06/25/2020    9:04 AM  PHQ 2/9 Scores  PHQ - 2 Score 0 0 0 0 0 0 0  PHQ- 9 Score  1 0 0       Fall Risk     04/03/2024    8:58 AM 03/03/2024    2:07 PM 10/22/2023    2:03 PM 02/18/2023    2:34 PM 02/02/2023   10:44 AM  Fall Risk   Falls in the past year? 1 1 1  0 0  Comment plays tennis competitively      Number falls in past yr: 1 1 1  0 0  Injury with Fall? 0 1 0 0 0  Risk  for fall due to : No Fall Risks  No Fall Risks No Fall Risks No Fall Risks  Follow up Education provided;Falls prevention discussed  Falls evaluation completed Falls evaluation completed Falls prevention discussed    MEDICARE RISK AT HOME:  Medicare Risk at Home Any stairs in or around the home?: (Patient-Rptd) Yes If so, are there any without handrails?: (Patient-Rptd) No Home free of  loose throw rugs in walkways, pet beds, electrical cords, etc?: (Patient-Rptd) Yes Adequate lighting in your home to reduce risk of falls?: (Patient-Rptd) Yes Life alert?: (Patient-Rptd) No Use of a cane, walker or w/c?: (Patient-Rptd) No Grab bars in the bathroom?: (Patient-Rptd) No Shower chair or bench in shower?: (Patient-Rptd) No Elevated toilet seat or a handicapped toilet?: (Patient-Rptd) No  TIMED UP AND GO:  Was the test performed?  No  Cognitive Function: 6CIT completed        04/07/2024    1:57 PM 02/02/2023   10:45 AM 01/27/2022   10:46 AM  6CIT Screen  What Year? 0 points 0 points 0 points  What month? 0 points 0 points 0 points  What time? 0 points 0 points 0 points  Count back from 20 0 points 0 points 0 points  Months in reverse 0 points 0 points 0 points  Repeat phrase 0 points 0 points 0 points  Total Score 0 points 0 points 0 points    Immunizations Immunization History  Administered Date(s) Administered   Fluad Quad(high Dose 65+) 06/25/2020   Influenza Split 06/03/2012   Influenza, Seasonal, Injecte, Preservative Fre 05/28/2015   Influenza-Unspecified 06/02/2014, 06/03/2018   PFIZER(Purple Top)SARS-COV-2 Vaccination 09/04/2019, 09/25/2019, 10/24/2019, 11/14/2019   Pneumococcal Polysaccharide-23 06/13/2013, 05/29/2019   Td 01/26/2005   Tdap 03/26/2015   Zoster, Live 02/13/2014    Screening Tests Health Maintenance  Topic Date Due   Zoster Vaccines- Shingrix (1 of 2) 04/01/1972   Pneumococcal Vaccine: 50+ Years (2 of 2 - PCV) 05/28/2020   Influenza Vaccine  03/03/2024   COVID-19 Vaccine (5 - 2025-26 season) 04/03/2024   HEMOGLOBIN A1C  08/25/2024   OPHTHALMOLOGY EXAM  09/28/2024   Fecal DNA (Cologuard)  10/10/2024   Diabetic kidney evaluation - eGFR measurement  02/22/2025   Diabetic kidney evaluation - Urine ACR  02/22/2025   FOOT EXAM  03/03/2025   DTaP/Tdap/Td (3 - Td or Tdap) 03/25/2025   Medicare Annual Wellness (AWV)  04/07/2025    Hepatitis C Screening  Completed   HPV VACCINES  Aged Out   Meningococcal B Vaccine  Aged Out    Health Maintenance Items Addressed: None due at this time. Pt will receive vaccines at their pharmcy or PCP visit when decided to obtain   Additional Screening:  Vision Screening: Recommended annual ophthalmology exams for early detection of glaucoma and other disorders of the eye. Is the patient up to date with their annual eye exam?  No  Who is the provider or what is the name of the office in which the patient attends annual eye exams? Dr Laurice  Dental Screening: Recommended annual dental exams for proper oral hygiene  Community Resource Referral / Chronic Care Management: CRR required this visit?  No   CCM required this visit?  No   Plan:    I have personally reviewed and noted the following in the patient's chart:   Medical and social history Use of alcohol, tobacco or illicit drugs  Current medications and supplements including opioid prescriptions. Patient is not currently taking opioid prescriptions. Functional ability and  status Nutritional status Physical activity Advanced directives List of other physicians Hospitalizations, surgeries, and ER visits in previous 12 months Vitals Screenings to include cognitive, depression, and falls Referrals and appointments  In addition, I have reviewed and discussed with patient certain preventive protocols, quality metrics, and best practice recommendations. A written personalized care plan for preventive services as well as general preventive health recommendations were provided to patient.   Erminio LITTIE Saris, LPN   [id number]   After Visit Summary: (MyChart) Due to this being a telephonic visit, the after visit summary with patients personalized plan was offered to patient via MyChart   Notes: Nothing significant to report at this time.

## 2024-04-07 NOTE — Patient Instructions (Signed)
 Garrett Woodard,  Thank you for taking the time for your Medicare Wellness Visit. I appreciate your continued commitment to your health goals. Please review the care plan we discussed, and feel free to reach out if I can assist you further.  Medicare recommends these wellness visits once per year to help you and your care team stay ahead of potential health issues. These visits are designed to focus on prevention, allowing your provider to concentrate on managing your acute and chronic conditions during your regular appointments.  Please note that Annual Wellness Visits do not include a physical exam. Some assessments may be limited, especially if the visit was conducted virtually. If needed, we may recommend a separate in-person follow-up with your provider.  Ongoing Care Seeing your primary care provider every 3 to 6 months helps us  monitor your health and provide consistent, personalized care. Next visit Feb 2026  Referrals If a referral was made during today's visit and you haven't received any updates within two weeks, please contact the referred provider directly to check on the status.  Recommended Screenings:  Health Maintenance  Topic Date Due   Zoster (Shingles) Vaccine (1 of 2) 04/01/1972   Pneumococcal Vaccine for age over 69 (2 of 2 - PCV) 05/28/2020   Flu Shot  03/03/2024   COVID-19 Vaccine (5 - 2025-26 season) 04/03/2024   Hemoglobin A1C  08/25/2024   Eye exam for diabetics  09/28/2024   Cologuard (Stool DNA test)  10/10/2024   Yearly kidney function blood test for diabetes  02/22/2025   Yearly kidney health urinalysis for diabetes  02/22/2025   Complete foot exam   03/03/2025   DTaP/Tdap/Td vaccine (3 - Td or Tdap) 03/25/2025   Medicare Annual Wellness Visit  04/07/2025   Hepatitis C Screening  Completed   HPV Vaccine  Aged Out   Meningitis B Vaccine  Aged Out       02/02/2023   10:45 AM  Advanced Directives  Does Patient Have a Medical Advance Directive? No  Would  patient like information on creating a medical advance directive? No - Patient declined   Advance Care Planning is important because it: Ensures you receive medical care that aligns with your values, goals, and preferences. Provides guidance to your family and loved ones, reducing the emotional burden of decision-making during critical moments.  Vision: Annual vision screenings are recommended for early detection of glaucoma, cataracts, and diabetic retinopathy. These exams can also reveal signs of chronic conditions such as diabetes and high blood pressure.  Dental: Annual dental screenings help detect early signs of oral cancer, gum disease, and other conditions linked to overall health, including heart disease and diabetes.

## 2024-05-05 ENCOUNTER — Other Ambulatory Visit: Payer: Self-pay | Admitting: Family Medicine

## 2024-05-05 DIAGNOSIS — E1129 Type 2 diabetes mellitus with other diabetic kidney complication: Secondary | ICD-10-CM

## 2024-05-08 NOTE — Telephone Encounter (Signed)
 Ozempic  Last filled:  02/25/24, #9 mL Last OV:  03/03/24, annual exam Next OV:  09/04/24, 6 mo DM f/u

## 2024-05-12 NOTE — Progress Notes (Signed)
 Cardiology Office Note  Date:  05/15/2024   ID:  Garrett Buice., DOB September 15, 1950, MRN [id number]  PCP:  Cleatus Arlyss RAMAN, MD   Chief Complaint  Patient presents with   Follow-up    Denies chest pain or shortness of breath.  Doing well.     HPI:  [name] is a 73 year old gentleman with  smoking history, stopped in 20-Jun-2004,  coronary artery disease with CABG in 06/20/04 , s/p ASD ,  Previous anginal sx with nausea, ache in jaw, tingling in arm obstructive sleep apnea, on CPAP,  hyperlipidemia,  diabetes type 2 with hemoglobin A1c greater than 8,  Bladder cancer who presents for follow-up of his coronary artery disease  Last seen by myself in clinic 10/23 Denies chest pain or shortness of breath concerning for angina Hobbies include fishing and musician  Playing golf, tennis , active, spends time down at [employer] Retired No concerns with his health  Lab work reviewed A1C 7.5 Total cholesterol 132 LDL 53  Father died June 20, 2020  Labs reviewed HBA1C 7.0, ran up to 9.0, 8.9 latest Total chol 155, LDL 58  Lab Results  Component Value Date   CHOL 132 02/23/2024   HDL 59.50 02/23/2024   LDLCALC 53 02/23/2024   TRIG 98.0 02/23/2024   EKG personally reviewed by myself on todays visit EKG Interpretation Date/Time:  Monday May 15 2024 09:26:03 EDT Ventricular Rate:  83 PR Interval:  208 QRS Duration:  96 QT Interval:  362 QTC Calculation: 425 R Axis:   57  Text Interpretation: Normal sinus rhythm Normal ECG When compared with ECG of 04-Oct-2003 12:36, Borderline criteria for Anterior infarct are no longer Present Borderline criteria for Anterolateral infarct are no longer Present T wave inversion no longer evident in Lateral leads Confirmed by Perla Lye [phone]) on 05/15/2024 9:49:00 AM   Other past medical history No recent stress test available Prior cardiac catheterization in June 20, 2004 not available. This was done at [facility]    PMH:   has a past medical  history of Allergy, Asthma, Blood transfusion without reported diagnosis (March 2022), Coronary artery disease (10/04/2003), Diabetes mellitus, History of kidney stones, Hyperlipidemia, Hypertension, Renal disorder, and Sleep apnea.  PSH:    Past Surgical History:  Procedure Laterality Date   CORONARY ANGIOPLASTY  10/04/2003   cath.  at [facility]): EF70%, 3 vessell dz. Dr. Ileen wnl 11/16/2003// Exercise Cardiolite wnl EF 60% 12/03/2003   CORONARY ARTERY BYPASS GRAFT  10/02/2003   CABG atrial septal defect repair   CYSTOSCOPY WITH FULGERATION N/A 12/24/2020   Procedure: CYSTOSCOPY WITH FULGERATION;  Surgeon: Kassie Ozell SAUNDERS, MD;  Location: [facility] ORS;  Service: Urology;  Laterality: N/A;   CYSTOSCOPY WITH FULGERATION N/A 12/27/2020   Procedure: CYSTOSCOPY WITH FULGERATION;  Surgeon: Kassie Ozell SAUNDERS, MD;  Location: [facility] ORS;  Service: Urology;  Laterality: N/A;   EYE SURGERY  Sept 2022   Cataracts   TRANSURETHRAL RESECTION OF BLADDER TUMOR N/A 11/07/2020   Procedure: TRANSURETHRAL RESECTION OF BLADDER TUMOR (TURBT) WITH MITOMYCIN ;  Surgeon: Kassie Ozell SAUNDERS, MD;  Location: [facility] ORS;  Service: Urology;  Laterality: N/A;    Current Outpatient Medications  Medication Sig Dispense Refill   lisinopril  (ZESTRIL ) 2.5 MG tablet TAKE 1 TABLET EVERY DAY 90 tablet 3   metFORMIN  (GLUCOPHAGE ) 500 MG tablet Take 1 tablet (500 mg total) by mouth daily with breakfast.     metoprolol  succinate (TOPROL -XL) 25 MG 24 hr tablet Take 0.5 tablets (12.5 mg total) by  mouth daily. Take with or immediately following a meal. 3 tablet 3   rosuvastatin  (CRESTOR ) 20 MG tablet TAKE 1 TABLET EVERY DAY 90 tablet 3   Semaglutide ,0.25 or 0.5MG /DOS, (OZEMPIC , 0.25 OR 0.5 MG/DOSE,) 2 MG/3ML SOPN INJECT 0.5MG  WEEKLY. 9 mL 3   No current facility-administered medications for this visit.    Allergies:   Patient has no known allergies.   Social History:  The patient  reports that he quit smoking about 21 years ago. His  smoking use included cigarettes. He has never used smokeless tobacco. He reports current alcohol use of about 6.0 standard drinks of alcohol per week. He reports that he does not use drugs.   Family History:   family history includes Alcohol abuse in his sister; Arthritis in his sister; Birth defects in his daughter; Cancer in his father; Depression in his mother; Heart disease (age of onset: 80) in his father; Hypertension in his mother; Stroke in his mother.    Review of Systems: Review of Systems  Constitutional: Negative.   Respiratory: Negative.    Cardiovascular: Negative.   Gastrointestinal: Negative.   Musculoskeletal: Negative.   Neurological: Negative.   Psychiatric/Behavioral: Negative.    All other systems reviewed and are negative.  PHYSICAL EXAM: VS:  BP 110/68 (BP Location: Left Arm, Patient Position: Sitting, Cuff Size: Normal)   Pulse 83   Ht 6' 2 (1.88 m)   Wt 219 lb (99.3 kg)   SpO2 97%   BMI 28.12 kg/m  , BMI Body mass index is 28.12 kg/m. Constitutional:  oriented to person, place, and time. No distress.  HENT:  Head: Grossly normal Eyes:  no discharge. No scleral icterus.  Neck: No JVD, no carotid bruits  Cardiovascular: Regular rate and rhythm, no murmurs appreciated Pulmonary/Chest: Clear to auscultation bilaterally, no wheezes or rales Abdominal: Soft.  no distension.  no tenderness.  Musculoskeletal: Normal range of motion Neurological:  normal muscle tone. Coordination normal. No atrophy Skin: Skin warm and dry Psychiatric: normal affect, pleasant   Recent Labs: 02/23/2024: ALT 23; BUN 10; Creatinine, Ser 0.97; Hemoglobin 14.2; Platelets 260.0; Potassium 4.4; Sodium 139    Lipid Panel Lab Results  Component Value Date   CHOL 132 02/23/2024   HDL 59.50 02/23/2024   LDLCALC 53 02/23/2024   TRIG 98.0 02/23/2024      Wt Readings from Last 3 Encounters:  05/15/24 219 lb (99.3 kg)  04/07/24 215 lb (97.5 kg)  03/03/24 215 lb 4 oz (97.6 kg)      ASSESSMENT AND PLAN:  Essential hypertension, benign Blood pressure is well controlled on today's visit. No changes made to the medications.  Mixed hyperlipidemia Cholesterol well-controlled on Crestor  20 daily Stressed importance in getting the A1c lower  Coronary artery disease of native artery of native heart with stable angina pectoris (HCC) Currently with no symptoms of angina. No further workup at this time. Continue current medication regimen. Stressed importance of aggressive diabetes control  Uncontrolled type 2 diabetes mellitus with other circulatory complication, without long-term current use of insulin  (HCC) A1c trending downward, weight stable to trending down over the past several years Active at baseline, plays golf, tennis We have encouraged continued exercise, careful diet management   Sleep apnea, unspecified type Keep weight low  S/P CABG (coronary artery bypass graft) previous surgery 2005 denies angina   Orders Placed This Encounter  Procedures   EKG 12-Lead    Signed, Velinda Lunger, M.D., Ph.D. 05/15/2024  [facility] [place], [place] [phone]

## 2024-05-15 ENCOUNTER — Encounter: Payer: Self-pay | Admitting: Cardiovascular Disease

## 2024-05-15 ENCOUNTER — Ambulatory Visit: Attending: Cardiovascular Disease | Admitting: Cardiovascular Disease

## 2024-05-15 VITALS — BP 110/68 | HR 83 | Ht 74.0 in | Wt 219.0 lb

## 2024-05-15 DIAGNOSIS — E1129 Type 2 diabetes mellitus with other diabetic kidney complication: Secondary | ICD-10-CM | POA: Diagnosis not present

## 2024-05-15 DIAGNOSIS — Z951 Presence of aortocoronary bypass graft: Secondary | ICD-10-CM | POA: Diagnosis not present

## 2024-05-15 DIAGNOSIS — R809 Proteinuria, unspecified: Secondary | ICD-10-CM | POA: Diagnosis not present

## 2024-05-15 DIAGNOSIS — E7800 Pure hypercholesterolemia, unspecified: Secondary | ICD-10-CM

## 2024-05-15 DIAGNOSIS — I10 Essential (primary) hypertension: Secondary | ICD-10-CM

## 2024-05-15 DIAGNOSIS — I25118 Atherosclerotic heart disease of native coronary artery with other forms of angina pectoris: Secondary | ICD-10-CM | POA: Diagnosis not present

## 2024-05-15 MED ORDER — METOPROLOL SUCCINATE ER 25 MG PO TB24: 12.5000 mg | ORAL_TABLET | Freq: Every day | ORAL | 3 refills | Status: AC

## 2024-05-15 NOTE — Patient Instructions (Addendum)
 Medication Instructions:   When you run out of metoprolol  tartrate change to metoprolol  succinate 1/2 pill daily  If you need a refill on your cardiac medications before your next appointment, please call your pharmacy.   Lab work: No new labs needed  Testing/Procedures: No new testing needed  Follow-Up: At [facility], you and your health needs are our priority.  As part of our continuing mission to provide you with exceptional heart care, we have created designated Provider Care Teams.  These Care Teams include your primary Cardiologist (physician) and Advanced Practice Providers (APPs -  Physician Assistants and Nurse Practitioners) who all work together to provide you with the care you need, when you need it.  You will need a follow up appointment in 12 months  Providers on your designated Care Team:   Lonni Meager, NP Bernardino Bring, PA-C Cadence [place], [place]  COVID-19 Vaccine Information can be found at: [email or web address] For questions related to vaccine distribution or [email or web address] please email [email or web address] or call [phone].

## 2024-05-24 ENCOUNTER — Other Ambulatory Visit: Payer: Self-pay | Admitting: Family Medicine

## 2024-09-04 ENCOUNTER — Ambulatory Visit: Admitting: Family Medicine

## 2024-10-02 ENCOUNTER — Ambulatory Visit: Admitting: Family Medicine

## 2025-04-11 ENCOUNTER — Ambulatory Visit
